# Patient Record
Sex: Male | Born: 1990 | Race: Black or African American | Hispanic: No | Marital: Single | Smoking: Current every day smoker
Health system: Southern US, Community
[De-identification: ages and names within clinical notes are randomized; demographics above are authoritative.]

## PROBLEM LIST (undated history)

## (undated) DIAGNOSIS — M549 Dorsalgia, unspecified: Secondary | ICD-10-CM

## (undated) DIAGNOSIS — W3400XA Accidental discharge from unspecified firearms or gun, initial encounter: Secondary | ICD-10-CM

## (undated) DIAGNOSIS — M25569 Pain in unspecified knee: Secondary | ICD-10-CM

## (undated) DIAGNOSIS — S71131A Puncture wound without foreign body, right thigh, initial encounter: Secondary | ICD-10-CM

## (undated) DIAGNOSIS — G8929 Other chronic pain: Secondary | ICD-10-CM

## (undated) DIAGNOSIS — S8990XA Unspecified injury of unspecified lower leg, initial encounter: Secondary | ICD-10-CM

## (undated) HISTORY — PX: KNEE SURGERY: SHX244

---
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
## 2010-05-15 ENCOUNTER — Emergency Department (HOSPITAL_COMMUNITY): Admission: EM | Admit: 2010-05-15 | Discharge: 2010-05-15 | Payer: Self-pay | Admitting: Emergency Medicine

## 2010-07-13 ENCOUNTER — Emergency Department (HOSPITAL_COMMUNITY)
Admission: EM | Admit: 2010-07-13 | Discharge: 2010-07-13 | Payer: Self-pay | Source: Home / Self Care | Admitting: Emergency Medicine

## 2010-09-29 ENCOUNTER — Emergency Department (HOSPITAL_COMMUNITY)
Admission: EM | Admit: 2010-09-29 | Discharge: 2010-09-29 | Disposition: A | Discharge status: Home / Self Care | Payer: Self-pay | Attending: Emergency Medicine | Admitting: Emergency Medicine

## 2010-09-29 ENCOUNTER — Emergency Department (HOSPITAL_COMMUNITY): Payer: Self-pay

## 2010-09-29 DIAGNOSIS — Y9361 Activity, american tackle football: Secondary | ICD-10-CM | POA: Insufficient documentation

## 2010-09-29 DIAGNOSIS — Y92009 Unspecified place in unspecified non-institutional (private) residence as the place of occurrence of the external cause: Secondary | ICD-10-CM | POA: Insufficient documentation

## 2010-09-29 DIAGNOSIS — S93409A Sprain of unspecified ligament of unspecified ankle, initial encounter: Secondary | ICD-10-CM | POA: Insufficient documentation

## 2010-09-29 DIAGNOSIS — X500XXA Overexertion from strenuous movement or load, initial encounter: Secondary | ICD-10-CM | POA: Insufficient documentation

## 2011-01-23 ENCOUNTER — Emergency Department (HOSPITAL_COMMUNITY)
Admission: EM | Admit: 2011-01-23 | Discharge: 2011-01-23 | Disposition: A | Discharge status: Home / Self Care | Payer: Self-pay | Attending: Emergency Medicine | Admitting: Emergency Medicine

## 2011-01-23 DIAGNOSIS — R300 Dysuria: Secondary | ICD-10-CM | POA: Insufficient documentation

## 2011-01-23 DIAGNOSIS — J029 Acute pharyngitis, unspecified: Secondary | ICD-10-CM | POA: Insufficient documentation

## 2011-01-23 DIAGNOSIS — J358 Other chronic diseases of tonsils and adenoids: Secondary | ICD-10-CM

## 2011-01-23 DIAGNOSIS — F17200 Nicotine dependence, unspecified, uncomplicated: Secondary | ICD-10-CM | POA: Insufficient documentation

## 2011-01-23 LAB — URINALYSIS, ROUTINE W REFLEX MICROSCOPIC
Bilirubin Urine: NEGATIVE
Glucose, UA: NEGATIVE mg/dL
Hgb urine dipstick: NEGATIVE
Ketones, ur: NEGATIVE mg/dL
Leukocytes, UA: NEGATIVE
Nitrite: NEGATIVE
Specific Gravity, Urine: <1.005 — ABNORMAL LOW (ref 1.005–1.030)
Urobilinogen, UA: >8 mg/dL — ABNORMAL HIGH (ref 0.0–1.0)
pH: 6.5 (ref 5.0–8.0)

## 2011-01-23 LAB — RAPID STREP SCREEN (MED CTR MEBANE ONLY): Streptococcus, Group A Screen (Direct): NEGATIVE

## 2011-01-23 MED ORDER — PENICILLIN G BENZATHINE 1200000 UNIT/2ML IM SUSP
1.2000 10*6.[IU] | Freq: Once | INTRAMUSCULAR | Status: AC
  Administered 2011-01-23: 1.2 10*6.[IU] via INTRAMUSCULAR
  Filled 2011-01-23: qty 2

## 2011-01-23 MED ORDER — ACETAMINOPHEN 325 MG PO TABS
650.0000 mg | ORAL_TABLET | Freq: Once | ORAL | Status: AC
  Administered 2011-01-23: 650 mg via ORAL
  Filled 2011-01-23: qty 2

## 2011-01-23 NOTE — ED Notes (Signed)
Left in c/o family for transport home; a&ox4, in no distress; verbalizes understanding of instructions given

## 2011-01-23 NOTE — ED Notes (Signed)
Pt reports he remains unable to provide UA specimen after encouraged to try

## 2011-01-23 NOTE — ED Provider Notes (Signed)
History    Written by Enos Fling acting as scribe for Nelia Shi, MD.  Chief Complaint  Patient presents with  . Sore Throat  . Dysuria   Patient is a 20 y.o. male presenting with pharyngitis and dysuria. The history is provided by the patient.  Sore Throat This is a new problem. The current episode started more than 2 days ago (3 days ago). The problem occurs constantly. The problem has been gradually worsening. Pertinent negatives include no chest pain, no abdominal pain, no headaches and no shortness of breath. The symptoms are aggravated by swallowing. The symptoms are relieved by nothing.  Dysuria  This is a new problem. The current episode started more than 2 days ago (3 days ago). The problem occurs intermittently. The problem has not changed since onset.The quality of the pain is described as burning. The pain is mild. The maximum temperature recorded prior to his arrival was 103 to 104 F (103 in ED, subjective f/c at home but did not record). The fever has been present for 1 to 2 days. He is sexually active (unprotected intercourse 4 days ago). Associated symptoms include chills. Pertinent negatives include no sweats, no nausea, no vomiting, no discharge, no hematuria and no flank pain.    History reviewed. No pertinent past medical history.  History reviewed. No pertinent past surgical history.  History reviewed. No pertinent family history.  History  Substance Use Topics  . Smoking status: Current Everyday Smoker  . Smokeless tobacco: Not on file  . Alcohol Use: No      Review of Systems  Constitutional: Positive for fever and chills.  HENT: Positive for sore throat. Negative for congestion.   Respiratory: Negative for shortness of breath.   Cardiovascular: Negative for chest pain.  Gastrointestinal: Negative for nausea, vomiting and abdominal pain.  Genitourinary: Positive for dysuria. Negative for hematuria and flank pain.  Neurological: Negative for  headaches.  All other systems reviewed and are negative.    Physical Exam  BP 104/62  Pulse 102  Temp(Src) 100.3 F (37.9 C) (Oral)  Resp 21  Ht 6\' 1"  (1.854 m)  Wt 225 lb 9.6 oz (102.331 kg)  BMI 29.76 kg/m2  SpO2 95%  Physical Exam  Nursing note and vitals reviewed. Constitutional: He is oriented to person, place, and time. He appears well-developed and well-nourished. No distress.  HENT:  Head: Normocephalic and atraumatic.  Mouth/Throat: Uvula is midline and mucous membranes are normal. Oropharyngeal exudate and posterior oropharyngeal erythema present. No tonsillar abscesses.       Left tonsil with exudative pharyngitis; no sign of abscess  Eyes: Pupils are equal, round, and reactive to light.  Neck: Normal range of motion.  Cardiovascular: Normal rate and intact distal pulses.   Pulmonary/Chest: No respiratory distress.  Abdominal: Normal appearance. He exhibits no distension.  Musculoskeletal: Normal range of motion.  Neurological: He is alert and oriented to person, place, and time. No cranial nerve deficit.  Skin: Skin is warm and dry. No rash noted.  Psychiatric: He has a normal mood and affect. His behavior is normal.    ED Course  Procedures Results for orders placed during the hospital encounter of 01/23/11  RAPID STREP SCREEN      Component Value Range   Streptococcus, Group A Screen (Direct) NEGATIVE  NEGATIVE    Rad: No results found.   MDM Fever, sore throat and left tonsillar swelling/exudate c/w exudative pharyngitis, rapid strep negative, will give 1.2 million units Bicillin to cover  pharyngitis as well as possible infection causing mild dysuria.    I personally performed the services described in this documentation, which was scribed in my presence. The recorded information has been reviewed and considered.   Nelia Shi, MD 01/28/11 [phone]

## 2011-01-23 NOTE — ED Notes (Signed)
Pt c/o burning with urination and sore throat x 3 days.

## 2011-01-23 NOTE — ED Notes (Signed)
Pt up to bathroom, but states unable to provide specimen

## 2011-01-26 ENCOUNTER — Emergency Department (HOSPITAL_COMMUNITY)
Admission: EM | Admit: 2011-01-26 | Discharge: 2011-01-26 | Disposition: A | Discharge status: Home / Self Care | Payer: Self-pay | Attending: Emergency Medicine | Admitting: Emergency Medicine

## 2011-01-26 ENCOUNTER — Encounter (HOSPITAL_COMMUNITY): Payer: Self-pay | Admitting: *Deleted

## 2011-01-26 DIAGNOSIS — F17200 Nicotine dependence, unspecified, uncomplicated: Secondary | ICD-10-CM | POA: Insufficient documentation

## 2011-01-26 DIAGNOSIS — J020 Streptococcal pharyngitis: Secondary | ICD-10-CM | POA: Insufficient documentation

## 2011-01-26 LAB — RAPID STREP SCREEN (MED CTR MEBANE ONLY): Streptococcus, Group A Screen (Direct): POSITIVE — AB

## 2011-01-26 MED ORDER — CEFTRIAXONE SODIUM 1 G IJ SOLR
1.0000 g | Freq: Once | INTRAMUSCULAR | Status: AC
  Administered 2011-01-26: 1 g via INTRAMUSCULAR
  Filled 2011-01-26: qty 1

## 2011-01-26 MED ORDER — IBUPROFEN 100 MG/5ML PO SUSP
ORAL | Status: AC
  Administered 2011-01-26: 800 mg
  Filled 2011-01-26: qty 5

## 2011-01-26 MED ORDER — HYDROCODONE-ACETAMINOPHEN 5-500 MG PO TABS: 1.0000 | ORAL_TABLET | ORAL | Status: AC | PRN

## 2011-01-26 MED ORDER — CLINDAMYCIN HCL 150 MG PO CAPS: 150.0000 mg | ORAL_CAPSULE | Freq: Three times a day (TID) | ORAL | Status: AC

## 2011-01-26 MED ORDER — CLINDAMYCIN HCL 150 MG PO CAPS
300.0000 mg | ORAL_CAPSULE | Freq: Once | ORAL | Status: AC
  Administered 2011-01-26: 300 mg via ORAL
  Filled 2011-01-26: qty 2

## 2011-01-26 MED ORDER — IBUPROFEN 100 MG/5ML PO SUSP
ORAL | Status: AC
  Filled 2011-01-26: qty 35

## 2011-01-26 MED ORDER — IBUPROFEN 100 MG/5ML PO SUSP
800.0000 mg | Freq: Once | ORAL | Status: DC
  Filled 2011-01-26: qty 40

## 2011-01-26 NOTE — ED Provider Notes (Signed)
Medical screening examination/treatment/procedure(s) were performed by non-physician practitioner and as supervising physician I was immediately available for consultation/collaboration.   Erubiel Manasco L Sayge Salvato, MD 01/26/11 2300 

## 2011-01-26 NOTE — ED Provider Notes (Signed)
History     Chief Complaint  Patient presents with  . Sore Throat   Patient is a 20 y.o. male presenting with pharyngitis. The history is provided by the patient.  Sore Throat This is a recurrent problem. The current episode started in the past 7 days. The problem occurs daily. The problem has been gradually worsening. Associated symptoms include fatigue, a fever, headaches and myalgias. Pertinent negatives include no abdominal pain, arthralgias, chest pain, coughing or neck pain. The symptoms are aggravated by drinking and eating. He has tried acetaminophen and NSAIDs (Bicillin LA) for the symptoms. The treatment provided no relief.    History reviewed. No pertinent past medical history.  History reviewed. No pertinent past surgical history.  No family history on file.  History  Substance Use Topics  . Smoking status: Current Everyday Smoker  . Smokeless tobacco: Not on file  . Alcohol Use: No      Review of Systems  Constitutional: Positive for fever and fatigue. Negative for activity change.       All ROS Neg except as noted in HPI  HENT: Negative for nosebleeds and neck pain.   Eyes: Negative for photophobia and discharge.  Respiratory: Negative for cough, shortness of breath and wheezing.   Cardiovascular: Negative for chest pain and palpitations.  Gastrointestinal: Negative for abdominal pain and blood in stool.  Genitourinary: Negative for dysuria, frequency and hematuria.  Musculoskeletal: Positive for myalgias. Negative for back pain and arthralgias.  Skin: Negative.   Neurological: Positive for headaches. Negative for dizziness, seizures and speech difficulty.  Psychiatric/Behavioral: Negative for hallucinations and confusion.    Physical Exam  BP 119/63  Pulse 98  Temp 99.4 F (37.4 C)  Resp 16  Ht 6\' 1"  (1.854 m)  Wt 225 lb (102.059 kg)  BMI 29.69 kg/m2  SpO2 99%  Physical Exam  Nursing note and vitals reviewed. Constitutional: He is oriented to  person, place, and time. He appears well-developed and well-nourished.  Non-toxic appearance.  HENT:  Head: Normocephalic.  Right Ear: Tympanic membrane and external ear normal.  Left Ear: Tympanic membrane and external ear normal.       Bilat tonsilar exudate and swelling. Increase redness of the posterior pharynx. No visible abscess  Eyes: EOM and lids are normal. Pupils are equal, round, and reactive to light.  Neck: Normal range of motion. Neck supple. Carotid bruit is not present.  Cardiovascular: Normal rate, regular rhythm, normal heart sounds, intact distal pulses and normal pulses.   Pulmonary/Chest: Breath sounds normal. No respiratory distress.  Abdominal: Soft. Bowel sounds are normal. There is no tenderness. There is no guarding.  Musculoskeletal: Normal range of motion.  Lymphadenopathy:       Head (right side): No submandibular adenopathy present.       Head (left side): No submandibular adenopathy present.    He has no cervical adenopathy.  Neurological: He is alert and oriented to person, place, and time. He has normal strength. No cranial nerve deficit or sensory deficit.  Skin: Skin is warm and dry.  Psychiatric: He has a normal mood and affect. His speech is normal.    ED Course  Procedures  MDM Rapid strep after bicillin POS. IM Rocephin given. Oral clindamycin also given. Will attempt to obtain a throat culture.      Kathie Dike, [place] 01/26/11 2010

## 2011-01-26 NOTE — ED Notes (Signed)
Sore throat x 2 weeks. Received a shot 2-3 days ago for sore throat. Pt states sore throat has gotten worse since last visit. NAD.

## 2011-01-28 LAB — STREP A DNA PROBE: Group A Strep Probe: NEGATIVE

## 2012-03-06 ENCOUNTER — Emergency Department (HOSPITAL_COMMUNITY)
Admission: EM | Admit: 2012-03-06 | Discharge: 2012-03-06 | Disposition: A | Discharge status: Home / Self Care | Payer: Self-pay | Attending: Emergency Medicine | Admitting: Emergency Medicine

## 2012-03-06 ENCOUNTER — Encounter (HOSPITAL_COMMUNITY): Payer: Self-pay | Admitting: *Deleted

## 2012-03-06 DIAGNOSIS — R21 Rash and other nonspecific skin eruption: Secondary | ICD-10-CM

## 2012-03-06 DIAGNOSIS — N5089 Other specified disorders of the male genital organs: Secondary | ICD-10-CM | POA: Insufficient documentation

## 2012-03-06 DIAGNOSIS — F17200 Nicotine dependence, unspecified, uncomplicated: Secondary | ICD-10-CM | POA: Insufficient documentation

## 2012-03-06 MED ORDER — CLOTRIMAZOLE-BETAMETHASONE 1-0.05 % EX CREA: TOPICAL_CREAM | CUTANEOUS | Status: AC

## 2012-03-06 NOTE — ED Notes (Signed)
Here to be checked for STD 

## 2012-03-06 NOTE — ED Provider Notes (Signed)
History  This chart was scribed for Timothy Lennert, MD by Ladona Ridgel Day. This patient was seen in room APA03/APA03 and the patient's care was started at 1734.   CSN: [id number]  Arrival date & time 03/06/12  1734   First MD Initiated Contact with Patient 03/06/12 1854      Chief Complaint  Patient presents with  . S74.5    Patient is a 21 y.o. male presenting with testicular pain. The history is provided by the patient. No language interpreter was used.  Testicle Pain This is a new problem. The current episode started yesterday. The problem occurs constantly. The problem has not changed since onset.Pertinent negatives include no chest pain and no abdominal pain. Nothing aggravates the symptoms. Nothing relieves the symptoms. He has tried nothing for the symptoms.   Timothy Ayala is a 21 y.o. male who presents to the Emergency Department to be check for an STD. He states he recently noticed some red bumps over his scrotum area but denies any penile discharge. He states had intercourse with a woman a few days ago but she told him that she does not have any STIs.    He has no PCP  History reviewed. No pertinent past medical history.  History reviewed. No pertinent past surgical history.  No family history on file.  History  Substance Use Topics  . Smoking status: Current Everyday Smoker  . Smokeless tobacco: Not on file  . Alcohol Use: No      Review of Systems  Constitutional: Negative for fatigue.  HENT: Negative for congestion, sinus pressure and ear discharge.   Eyes: Negative for discharge.  Respiratory: Negative for cough.   Cardiovascular: Negative for chest pain.  Gastrointestinal: Negative for abdominal pain and diarrhea.  Genitourinary: Positive for testicular pain. Negative for frequency and hematuria.  Musculoskeletal: Negative for back pain.  Skin: Positive for rash (Red bumps over his scrotal area.).  Neurological: Negative for seizures.  Hematological:  Negative.   Psychiatric/Behavioral: Negative for hallucinations.  All other systems reviewed and are negative.    Allergies  Review of patient's allergies indicates no known allergies.  Home Medications  No current outpatient prescriptions on file.  Triage Vitals: BP 117/59  Pulse 66  Temp 98.6 F (37 C) (Oral)  Resp 16  Ht 6\' 4"  (1.93 m)  Wt 210 lb (95.255 kg)  BMI 25.56 kg/m2  SpO2 100%  Physical Exam  Nursing note and vitals reviewed. Constitutional: He is oriented to person, place, and time. He appears well-developed.  HENT:  Head: Normocephalic.  Eyes: Conjunctivae are normal.  Neck: No tracheal deviation present.  Cardiovascular:  No murmur heard. Genitourinary:       Swelling and tenderness over scrotum.   Musculoskeletal: Normal range of motion.  Neurological: He is oriented to person, place, and time.  Skin: Skin is warm.  Psychiatric: He has a normal mood and affect.    ED Course  Procedures (including critical care time) DIAGNOSTIC STUDIES: Oxygen Saturation is 100% on room air, normal by my interpretation.    COORDINATION OF CARE: At 710 PM Discussed treatment plan with patient which includes GC/chlyamydia screen. Patient agrees.   Labs Reviewed - No data to display No results found.   No diagnosis found.   Possible early fungal rash MDM  The chart was scribed for me under my direct supervision.  I personally performed the history, physical, and medical decision making and all procedures in the evaluation of this patient.Marland Kitchen  Timothy Lennert, MD 03/06/12 1929

## 2012-03-06 NOTE — ED Notes (Signed)
Pt denies any symptoms other than bumps to scrotal area. Denies open sores, "just bumps":

## 2012-03-06 NOTE — ED Notes (Signed)
Denies pain/discharge.

## 2013-02-27 ENCOUNTER — Emergency Department (HOSPITAL_COMMUNITY)
Admission: EM | Admit: 2013-02-27 | Discharge: 2013-02-27 | Disposition: A | Discharge status: Home / Self Care | Payer: Self-pay

## 2013-04-16 ENCOUNTER — Emergency Department (HOSPITAL_COMMUNITY)
Admission: EM | Admit: 2013-04-16 | Discharge: 2013-04-16 | Disposition: A | Discharge status: Home / Self Care | Payer: Self-pay | Attending: Emergency Medicine | Admitting: Emergency Medicine

## 2013-04-16 ENCOUNTER — Emergency Department (HOSPITAL_COMMUNITY): Payer: Self-pay

## 2013-04-16 ENCOUNTER — Encounter (HOSPITAL_COMMUNITY): Payer: Self-pay | Admitting: Emergency Medicine

## 2013-04-16 DIAGNOSIS — N419 Inflammatory disease of prostate, unspecified: Secondary | ICD-10-CM | POA: Insufficient documentation

## 2013-04-16 DIAGNOSIS — N390 Urinary tract infection, site not specified: Secondary | ICD-10-CM | POA: Insufficient documentation

## 2013-04-16 DIAGNOSIS — F17200 Nicotine dependence, unspecified, uncomplicated: Secondary | ICD-10-CM | POA: Insufficient documentation

## 2013-04-16 DIAGNOSIS — M549 Dorsalgia, unspecified: Secondary | ICD-10-CM | POA: Insufficient documentation

## 2013-04-16 LAB — CBC WITH DIFFERENTIAL/PLATELET
Basophils Absolute: 0 10*3/uL (ref 0.0–0.1)
Eosinophils Relative: 7 % — ABNORMAL HIGH (ref 0–5)
HCT: 40.2 % (ref 39.0–52.0)
Lymphocytes Relative: 23 % (ref 12–46)
Lymphs Abs: 2.3 10*3/uL (ref 0.7–4.0)
MCV: 89.3 fL (ref 78.0–100.0)
Monocytes Absolute: 0.9 10*3/uL (ref 0.1–1.0)
RDW: 12.5 % (ref 11.5–15.5)
WBC: 9.8 10*3/uL (ref 4.0–10.5)

## 2013-04-16 LAB — BASIC METABOLIC PANEL
BUN: 7 mg/dL (ref 6–23)
CO2: 26 mEq/L (ref 19–32)
Calcium: 10.1 mg/dL (ref 8.4–10.5)
Creatinine, Ser: 0.94 mg/dL (ref 0.50–1.35)
Glucose, Bld: 82 mg/dL (ref 70–99)

## 2013-04-16 LAB — URINE MICROSCOPIC-ADD ON

## 2013-04-16 LAB — URINALYSIS, ROUTINE W REFLEX MICROSCOPIC
Glucose, UA: NEGATIVE mg/dL
Hgb urine dipstick: NEGATIVE
Protein, ur: NEGATIVE mg/dL

## 2013-04-16 MED ORDER — DOXYCYCLINE HYCLATE 100 MG PO CAPS: 100.0000 mg | ORAL_CAPSULE | Freq: Two times a day (BID) | ORAL | Status: DC

## 2013-04-16 MED ORDER — CEFTRIAXONE SODIUM 250 MG IJ SOLR
250.0000 mg | Freq: Once | INTRAMUSCULAR | Status: AC
  Administered 2013-04-16: 250 mg via INTRAMUSCULAR
  Filled 2013-04-16: qty 250

## 2013-04-16 MED ORDER — HYDROMORPHONE HCL PF 1 MG/ML IJ SOLN
1.0000 mg | Freq: Once | INTRAMUSCULAR | Status: AC
  Administered 2013-04-16: 1 mg via INTRAVENOUS
  Filled 2013-04-16: qty 1

## 2013-04-16 MED ORDER — IOHEXOL 300 MG/ML  SOLN
100.0000 mL | Freq: Once | INTRAMUSCULAR | Status: AC | PRN
  Administered 2013-04-16: 100 mL via INTRAVENOUS

## 2013-04-16 MED ORDER — SODIUM CHLORIDE 0.9 % IV SOLN
INTRAVENOUS | Status: DC
  Administered 2013-04-16: 17:00:00 via INTRAVENOUS

## 2013-04-16 MED ORDER — NAPROXEN 500 MG PO TABS
500.0000 mg | ORAL_TABLET | Freq: Two times a day (BID) | ORAL | Status: DC
Start: 2013-04-16 — End: 2013-11-18

## 2013-04-16 MED ORDER — SODIUM CHLORIDE 0.9 % IV BOLUS (SEPSIS)
500.0000 mL | Freq: Once | INTRAVENOUS | Status: AC
  Administered 2013-04-16: 16:00:00 via INTRAVENOUS

## 2013-04-16 MED ORDER — IOHEXOL 300 MG/ML  SOLN
50.0000 mL | Freq: Once | INTRAMUSCULAR | Status: AC | PRN
  Administered 2013-04-16: 50 mL via ORAL

## 2013-04-16 MED ORDER — ONDANSETRON HCL 4 MG/2ML IJ SOLN
4.0000 mg | Freq: Once | INTRAMUSCULAR | Status: AC
  Administered 2013-04-16: 4 mg via INTRAVENOUS
  Filled 2013-04-16: qty 2

## 2013-04-16 MED ORDER — LIDOCAINE HCL (PF) 1 % IJ SOLN
INTRAMUSCULAR | Status: AC
  Administered 2013-04-16: 5 mL via INTRAMUSCULAR
  Filled 2013-04-16: qty 5

## 2013-04-16 NOTE — ED Notes (Addendum)
Severe L pelvic pain beginning below umbilicus and radiating to L back. Sharp stabbing pain began 2 weeks ago and has been unrelenting.  Patient tearful.  Has seen some blood in urine, no discharge, no burning w/urination.  Denies blood in stool. No vomiting or diarrhea.  Has tried drinking cranberry juice.  Has not taken any medication.

## 2013-04-16 NOTE — ED Provider Notes (Signed)
CSN: [id number]     Arrival date & time 04/16/13  1453 History  This chart was scribed for Shelda Jakes, MD by Danella Maiers, ED Scribe. This patient was seen in room APA16A/APA16A and the patient's care was started at 3:18 PM.   Chief Complaint  Patient presents with  . Pelvic Pain   Patient is a 22 y.o. male presenting with pelvic pain. The history is provided by the patient. No language interpreter was used.  Pelvic Pain This is a new problem. The current episode started more than 1 week ago. The problem occurs constantly. The problem has been gradually worsening. Associated symptoms include abdominal pain. Pertinent negatives include no chest pain, no headaches and no shortness of breath. Nothing relieves the symptoms. He has tried nothing for the symptoms.   HPI Comments: VIRGINIO ISIDORE is a 22 y.o. male who presents to the Emergency Department complaining of severe, waxing and waning, stabbing LLQ abdominal pain that radiates to left back onset two weeks ago. He states the pain started in his testicles and lasted for one day before it moved to his abdomen and denies testicular pain since. He rates the severity as 10/10. He also reports urinary urgency. Pt reports associated hematuria. Pt has tried drinking cranberry juice. He has not tried any medication. He denies h/o kidney pain.  History reviewed. No pertinent past medical history. No past surgical history on file. No family history on file. History  Substance Use Topics  . Smoking status: Current Every Day Smoker  . Smokeless tobacco: Not on file  . Alcohol Use: No    Review of Systems  Constitutional: Negative for fever and chills.  HENT: Negative for rhinorrhea and sore throat.   Eyes: Negative for visual disturbance.  Respiratory: Negative for cough and shortness of breath.   Cardiovascular: Negative for chest pain and leg swelling.  Gastrointestinal: Positive for abdominal pain. Negative for nausea, vomiting,  diarrhea and blood in stool.  Genitourinary: Positive for urgency and pelvic pain. Negative for dysuria and discharge.  Musculoskeletal: Positive for back pain.  Skin: Negative for rash.  Neurological: Negative for headaches.  Hematological: Does not bruise/bleed easily.  Psychiatric/Behavioral: Negative for confusion.  All other systems reviewed and are negative.    Allergies  Review of patient's allergies indicates no known allergies.  Home Medications  No current outpatient prescriptions on file. BP 136/74  Pulse 73  Temp(Src) 97.4 F (36.3 C) (Oral)  Resp 19  SpO2 100% Physical Exam  Nursing note and vitals reviewed. Constitutional: He is oriented to person, place, and time. He appears well-developed and well-nourished. No distress.  HENT:  Head: Normocephalic and atraumatic.  Eyes: EOM are normal. No scleral icterus.  Neck: Neck supple. No tracheal deviation present.  Cardiovascular: Normal rate, regular rhythm and normal heart sounds.   Pulmonary/Chest: Effort normal and breath sounds normal. No respiratory distress.  Abdominal: He exhibits no mass. There is tenderness (LLQ and left groin).  Genitourinary: Penis normal. No penile tenderness.  Tenderness in the groin area. Testicles non-tender. No hernia.  Musculoskeletal: Normal range of motion.  Neurological: He is alert and oriented to person, place, and time. No cranial nerve deficit. He exhibits normal muscle tone. Coordination normal.  Skin: Skin is warm and dry.  Psychiatric: He has a normal mood and affect. His behavior is normal.    ED Course  Procedures (including critical care time) Medications  0.9 %  sodium chloride infusion ( Intravenous Stopped 04/16/13 1809)  sodium  chloride 0.9 % bolus 500 mL (0 mLs Intravenous Stopped 04/16/13 1645)  ondansetron (ZOFRAN) injection 4 mg (4 mg Intravenous Given 04/16/13 1617)  HYDROmorphone (DILAUDID) injection 1 mg (1 mg Intravenous Given 04/16/13 1617)  iohexol  (OMNIPAQUE) 300 MG/ML solution 50 mL (50 mLs Oral Contrast Given 04/16/13 1752)  iohexol (OMNIPAQUE) 300 MG/ML solution 100 mL (100 mLs Intravenous Contrast Given 04/16/13 1813)    DIAGNOSTIC STUDIES: Oxygen Saturation is 100% on RA, normal by my interpretation.    COORDINATION OF CARE: 4:06 PM- Discussed treatment plan with pt which includes blood work, UA, and pain medication. Pt agrees to plan.   Results for orders placed during the hospital encounter of 04/16/13  URINALYSIS, ROUTINE W REFLEX MICROSCOPIC      Result Value Range   Color, Urine YELLOW  YELLOW   APPearance CLEAR  CLEAR   Specific Gravity, Urine 1.010  1.005 - 1.030   pH 6.0  5.0 - 8.0   Glucose, UA NEGATIVE  NEGATIVE mg/dL   Hgb urine dipstick NEGATIVE  NEGATIVE   Bilirubin Urine NEGATIVE  NEGATIVE   Ketones, ur NEGATIVE  NEGATIVE mg/dL   Protein, ur NEGATIVE  NEGATIVE mg/dL   Urobilinogen, UA 0.2  0.0 - 1.0 mg/dL   Nitrite NEGATIVE  NEGATIVE   Leukocytes, UA TRACE (*) NEGATIVE  CBC WITH DIFFERENTIAL      Result Value Range   WBC 9.8  4.0 - 10.5 K/uL   RBC 4.50  4.22 - 5.81 MIL/uL   Hemoglobin 14.6  13.0 - 17.0 g/dL   HCT 78.2  95.6 - 21.3 %   MCV 89.3  78.0 - 100.0 fL   MCH 32.4  26.0 - 34.0 pg   MCHC 36.3 (*) 30.0 - 36.0 g/dL   RDW 08.6  57.8 - 46.9 %   Platelets 253  150 - 400 K/uL   Neutrophils Relative % 61  43 - 77 %   Neutro Abs 5.9  1.7 - 7.7 K/uL   Lymphocytes Relative 23  12 - 46 %   Lymphs Abs 2.3  0.7 - 4.0 K/uL   Monocytes Relative 9  3 - 12 %   Monocytes Absolute 0.9  0.1 - 1.0 K/uL   Eosinophils Relative 7 (*) 0 - 5 %   Eosinophils Absolute 0.7  0.0 - 0.7 K/uL   Basophils Relative 0  0 - 1 %   Basophils Absolute 0.0  0.0 - 0.1 K/uL  BASIC METABOLIC PANEL      Result Value Range   Sodium 140  135 - 145 mEq/L   Potassium 3.4 (*) 3.5 - 5.1 mEq/L   Chloride 100  96 - 112 mEq/L   CO2 26  19 - 32 mEq/L   Glucose, Bld 82  70 - 99 mg/dL   BUN 7  6 - 23 mg/dL   Creatinine, Ser 6.29  0.50 -  1.35 mg/dL   Calcium 52.8  8.4 - 41.3 mg/dL   GFR calc non Af Amer >90  >90 mL/min   GFR calc Af Amer >90  >90 mL/min  URINE MICROSCOPIC-ADD ON      Result Value Range   WBC, UA 3-6  <3 WBC/hpf   Bacteria, UA RARE  RARE   Ct Abdomen Pelvis W Contrast  04/16/2013   CLINICAL DATA:  Left-sided pelvic and back pain for 2 weeks.  EXAM: CT ABDOMEN AND PELVIS WITH CONTRAST  TECHNIQUE: Multidetector CT imaging of the abdomen and pelvis was performed using the standard  protocol following bolus administration of intravenous contrast.  CONTRAST:  50mL OMNIPAQUE IOHEXOL 300 MG/ML SOLN, OMNIPAQUE IOHEXOL 300 MG/ML SOLN  COMPARISON:  None.  FINDINGS: Small area of lucency in the posterior right lower lobe may be from air trapping. The lung bases are otherwise clear.  Normal liver, spleen, gallbladder and pancreas. No bile duct dilation. No adrenal masses. Normal kidneys and ureters.  There are inflammatory changes along the posterior wall of the bladder contiguous with the seminal vesicles and superior prostate gland. This is also contiguous with a portion of the sigmoid colon. Possibly all views included mild cystitis or prostatitis. Mild diverticulitis should also be considered in the proper clinical setting although was fell least likely. No bladder mass or stone is seen. Trace pelvic free fluid is noted.  Mild increased stool is noted in the colon. The bowel is otherwise unremarkable. A normal appendix is visualized.  Minor Schmorl's nodes are noted mostly of the lower thoracic spine. No other bony abnormality.  IMPRESSION: 1. Mild inflammatory changes are noted in the lower pelvic fat along the posterior margin of the bladder contiguous with the upper prostate and seminal vesicles. This is nonspecific. A trace amount pelvic free fluid is also present. Consider prostatitis, a mild cystitis or possible a mild uncomplicated diverticulitis. 2. No other evidence of an acute abnormality. A normal appendix is  visualized. 3. Small area of presumed air trapping in the right lower 4. No other abnormalities.   Electronically Signed   By: Amie Portland M.D.   On: 04/16/2013 18:33        MDM   1. Prostatitis    Clinically more consistent with prostate infection. Will treat as such. Doubt diverticulitis. Nontoxic NAD. Patient will be given antibiotic course.       I personally performed the services described in this documentation, which was scribed in my presence. The recorded information has been reviewed and is accurate.     Shelda Jakes, MD 04/20/13 [phone]

## 2013-04-16 NOTE — ED Notes (Signed)
Indicated pain in left groin area for two weeks. Denies any urinary symptoms or duscharge

## 2013-07-02 DIAGNOSIS — S71131A Puncture wound without foreign body, right thigh, initial encounter: Secondary | ICD-10-CM

## 2013-07-02 DIAGNOSIS — S8990XA Unspecified injury of unspecified lower leg, initial encounter: Secondary | ICD-10-CM

## 2013-07-02 HISTORY — DX: Puncture wound without foreign body, right thigh, initial encounter: S71.131A

## 2013-07-02 HISTORY — DX: Unspecified injury of unspecified lower leg, initial encounter: S89.90XA

## 2013-11-18 ENCOUNTER — Emergency Department (HOSPITAL_COMMUNITY)
Admission: EM | Admit: 2013-11-18 | Discharge: 2013-11-18 | Disposition: A | Discharge status: Home / Self Care | Payer: Self-pay | Attending: Emergency Medicine | Admitting: Emergency Medicine

## 2013-11-18 ENCOUNTER — Encounter (HOSPITAL_COMMUNITY): Payer: Self-pay | Admitting: Emergency Medicine

## 2013-11-18 DIAGNOSIS — Z79899 Other long term (current) drug therapy: Secondary | ICD-10-CM | POA: Insufficient documentation

## 2013-11-18 DIAGNOSIS — Z113 Encounter for screening for infections with a predominantly sexual mode of transmission: Secondary | ICD-10-CM | POA: Insufficient documentation

## 2013-11-18 DIAGNOSIS — Z202 Contact with and (suspected) exposure to infections with a predominantly sexual mode of transmission: Secondary | ICD-10-CM

## 2013-11-18 DIAGNOSIS — F17200 Nicotine dependence, unspecified, uncomplicated: Secondary | ICD-10-CM | POA: Insufficient documentation

## 2013-11-18 DIAGNOSIS — Z791 Long term (current) use of non-steroidal anti-inflammatories (NSAID): Secondary | ICD-10-CM | POA: Insufficient documentation

## 2013-11-18 LAB — URINALYSIS, ROUTINE W REFLEX MICROSCOPIC
BILIRUBIN URINE: NEGATIVE
Glucose, UA: NEGATIVE mg/dL
Hgb urine dipstick: NEGATIVE
Ketones, ur: NEGATIVE mg/dL
LEUKOCYTES UA: NEGATIVE
NITRITE: NEGATIVE
PH: 6 (ref 5.0–8.0)
Protein, ur: NEGATIVE mg/dL
Specific Gravity, Urine: >1.03 — ABNORMAL HIGH (ref 1.005–1.030)
Urobilinogen, UA: 1 mg/dL (ref 0.0–1.0)

## 2013-11-18 NOTE — ED Provider Notes (Signed)
CSN: [id number]     Arrival date & time 11/18/13  2007 History   First MD Initiated Contact with Patient 11/18/13 2019     Chief Complaint  Patient presents with  . Illegal value: [     (Consider location/radiation/quality/duration/timing/severity/associated sxs/prior Treatment) HPI Josie SaundersJerrell D Musselman is a 23 y.o. male who presents to the ED requesting STI screening. He states that he last had sex one week ago and 2 days ago his girlfriend told him she is having vaginal discharge and is going to get tested tomorrow. The patient denies any discharge, dysuria or other problems.   History reviewed. No pertinent past medical history. History reviewed. No pertinent past surgical history. History reviewed. No pertinent family history. History  Substance Use Topics  . Smoking status: Current Every Day Smoker    Types: Cigarettes  . Smokeless tobacco: Not on file  . Alcohol Use: No    Review of Systems Negative except as stated in HPI   Allergies  Review of patient's allergies indicates no known allergies.  Home Medications   Prior to Admission medications   Medication Sig Start Date End Date Taking? Authorizing Provider  doxycycline (VIBRAMYCIN) 100 MG capsule Take 1 capsule (100 mg total) by mouth 2 (two) times daily. 04/16/13   Shelda JakesScott W. Zackowski, MD  naproxen (NAPROSYN) 500 MG tablet Take 1 tablet (500 mg total) by mouth 2 (two) times daily with a meal. 04/16/13   Shelda JakesScott W. Zackowski, MD   BP 135/52  Pulse 67  Temp(Src) 98.5 F (36.9 C) (Oral)  Resp 24  Ht 6\' 4"  (1.93 m)  Wt 210 lb (95.255 kg)  BMI 25.57 kg/m2  SpO2 100% Physical Exam  Nursing note and vitals reviewed. Constitutional: He is oriented to person, place, and time. He appears well-developed and well-nourished. No distress.  HENT:  Head: Normocephalic.  Eyes: Conjunctivae and EOM are normal.  Neck: Neck supple.  Cardiovascular: Normal rate.   Pulmonary/Chest: Effort normal.  Abdominal: Soft. There is no  tenderness.  Genitourinary: Testes normal. Circumcised. No penile erythema or penile tenderness. No discharge found.  Musculoskeletal: Normal range of motion.  Lymphadenopathy:       Right: No inguinal adenopathy present.       Left: No inguinal adenopathy present.  Neurological: He is alert and oriented to person, place, and time. No cranial nerve deficit.  Skin: Skin is warm and dry.  Psychiatric: He has a normal mood and affect. His behavior is normal.    ED Course  Procedures   MDM  23 y.o. male with possible STI exposure. Tested for GC and Chlamydia. Encouraged patient to follow up with health department for further screening such as HIV, Hepatitis and syphilis. Discussed with the patient and all questioned fully answered. Will call patient if cultures are positive.   [address] South DriveHope InyokernM Yolanda Dockendorf, [place]NP 11/19/13 [phone]

## 2013-11-18 NOTE — ED Notes (Signed)
Pt states his girlfriend is having discharge. Wants to be checked for STD. Denies having any problems himself,

## 2013-11-18 NOTE — ED Notes (Signed)
Here to be checked for STD per pt.

## 2013-11-18 NOTE — Discharge Instructions (Signed)
You can go to the health department for further screening with blood test for HIV, syphilis and Hepatitis. We will call you if your cultures for Gonorrhea or Chlamydia are positive.

## 2013-11-18 NOTE — ED Notes (Signed)
Making sure i have not been exposed.

## 2013-11-18 NOTE — ED Notes (Signed)
Pt alert & oriented x4, stable gait. Patient given discharge instructions, paperwork & prescription(s). Patient  instructed to stop at the registration desk to finish any additional paperwork. Patient verbalized understanding. Pt left department w/ no further questions. 

## 2013-11-19 NOTE — ED Provider Notes (Signed)
Medical screening examination/treatment/procedure(s) were performed by non-physician practitioner and as supervising physician I was immediately available for consultation/collaboration.   EKG Interpretation None        Sallyanne Birkhead, MD 11/19/13 0127 

## 2013-11-20 LAB — GC/CHLAMYDIA PROBE AMP
CT PROBE, AMP APTIMA: NEGATIVE
GC Probe RNA: NEGATIVE

## 2014-01-10 ENCOUNTER — Emergency Department (HOSPITAL_COMMUNITY): Payer: No Typology Code available for payment source

## 2014-01-10 ENCOUNTER — Observation Stay (HOSPITAL_COMMUNITY)
Admission: EM | Admit: 2014-01-10 | Discharge: 2014-01-11 | Disposition: A | Discharge status: Home / Self Care | Payer: No Typology Code available for payment source | Attending: General Surgery | Admitting: General Surgery

## 2014-01-10 DIAGNOSIS — S71109A Unspecified open wound, unspecified thigh, initial encounter: Secondary | ICD-10-CM

## 2014-01-10 DIAGNOSIS — S01502A Unspecified open wound of oral cavity, initial encounter: Secondary | ICD-10-CM | POA: Diagnosis not present

## 2014-01-10 DIAGNOSIS — S81809A Unspecified open wound, unspecified lower leg, initial encounter: Principal | ICD-10-CM

## 2014-01-10 DIAGNOSIS — S81009A Unspecified open wound, unspecified knee, initial encounter: Principal | ICD-10-CM | POA: Insufficient documentation

## 2014-01-10 DIAGNOSIS — S91009A Unspecified open wound, unspecified ankle, initial encounter: Secondary | ICD-10-CM | POA: Diagnosis not present

## 2014-01-10 DIAGNOSIS — D62 Acute posthemorrhagic anemia: Secondary | ICD-10-CM | POA: Diagnosis not present

## 2014-01-10 DIAGNOSIS — Y92009 Unspecified place in unspecified non-institutional (private) residence as the place of occurrence of the external cause: Secondary | ICD-10-CM | POA: Insufficient documentation

## 2014-01-10 DIAGNOSIS — F17200 Nicotine dependence, unspecified, uncomplicated: Secondary | ICD-10-CM | POA: Insufficient documentation

## 2014-01-10 DIAGNOSIS — T148XXA Other injury of unspecified body region, initial encounter: Secondary | ICD-10-CM | POA: Diagnosis present

## 2014-01-10 DIAGNOSIS — W3400XA Accidental discharge from unspecified firearms or gun, initial encounter: Secondary | ICD-10-CM

## 2014-01-10 DIAGNOSIS — IMO0002 Reserved for concepts with insufficient information to code with codable children: Secondary | ICD-10-CM

## 2014-01-10 DIAGNOSIS — S71009A Unspecified open wound, unspecified hip, initial encounter: Secondary | ICD-10-CM

## 2014-01-10 LAB — CBC WITH DIFFERENTIAL/PLATELET
BASOS ABS: 0 10*3/uL (ref 0.0–0.1)
BASOS PCT: 0 % (ref 0–1)
Eosinophils Absolute: 0.4 10*3/uL (ref 0.0–0.7)
Eosinophils Relative: 5 % (ref 0–5)
HCT: 37.1 % — ABNORMAL LOW (ref 39.0–52.0)
Hemoglobin: 13.3 g/dL (ref 13.0–17.0)
Lymphocytes Relative: 35 % (ref 12–46)
Lymphs Abs: 3 10*3/uL (ref 0.7–4.0)
MCH: 31.7 pg (ref 26.0–34.0)
MCHC: 35.8 g/dL (ref 30.0–36.0)
MCV: 88.5 fL (ref 78.0–100.0)
Monocytes Absolute: 0.5 10*3/uL (ref 0.1–1.0)
Monocytes Relative: 6 % (ref 3–12)
NEUTROS ABS: 4.4 10*3/uL (ref 1.7–7.7)
NEUTROS PCT: 54 % (ref 43–77)
PLATELETS: 227 10*3/uL (ref 150–400)
RBC: 4.19 MIL/uL — ABNORMAL LOW (ref 4.22–5.81)
RDW: 12.3 % (ref 11.5–15.5)
WBC: 8.3 10*3/uL (ref 4.0–10.5)

## 2014-01-10 LAB — COMPREHENSIVE METABOLIC PANEL
ALBUMIN: 4.3 g/dL (ref 3.5–5.2)
ALT: 14 U/L (ref 0–53)
AST: 17 U/L (ref 0–37)
Alkaline Phosphatase: 38 U/L — ABNORMAL LOW (ref 39–117)
Anion gap: 22 — ABNORMAL HIGH (ref 5–15)
BUN: 7 mg/dL (ref 6–23)
CHLORIDE: 100 meq/L (ref 96–112)
CO2: 20 mEq/L (ref 19–32)
Calcium: 9.2 mg/dL (ref 8.4–10.5)
Creatinine, Ser: 1.19 mg/dL (ref 0.50–1.35)
GFR calc Af Amer: >90 mL/min (ref >90)
GFR, EST NON AFRICAN AMERICAN: 86 mL/min — AB (ref >90)
Glucose, Bld: 67 mg/dL — ABNORMAL LOW (ref 70–99)
Potassium: 3 mEq/L — ABNORMAL LOW (ref 3.7–5.3)
SODIUM: 142 meq/L (ref 137–147)
Total Bilirubin: 0.3 mg/dL (ref 0.3–1.2)
Total Protein: 7.5 g/dL (ref 6.0–8.3)

## 2014-01-10 LAB — CBG MONITORING, ED: Glucose-Capillary: 92 mg/dL (ref 70–99)

## 2014-01-10 MED ORDER — SODIUM CHLORIDE 0.9 % IV BOLUS (SEPSIS)
1000.0000 mL | Freq: Once | INTRAVENOUS | Status: AC
  Administered 2014-01-10: 1000 mL via INTRAVENOUS

## 2014-01-10 MED ORDER — OXYCODONE HCL 5 MG PO TABS
10.0000 mg | ORAL_TABLET | ORAL | Status: DC | PRN
  Administered 2014-01-11 (×2): 10 mg via ORAL
  Filled 2014-01-10 (×2): qty 2

## 2014-01-10 MED ORDER — KCL IN DEXTROSE-NACL 20-5-0.45 MEQ/L-%-% IV SOLN
INTRAVENOUS | Status: DC
  Administered 2014-01-10: 23:00:00 via INTRAVENOUS
  Filled 2014-01-10 (×2): qty 1000

## 2014-01-10 MED ORDER — HYDROMORPHONE HCL PF 1 MG/ML IJ SOLN
1.0000 mg | Freq: Once | INTRAMUSCULAR | Status: AC
Start: 2014-01-10 — End: 2014-01-10
  Administered 2014-01-10: 1 mg via INTRAVENOUS

## 2014-01-10 MED ORDER — TETANUS-DIPHTH-ACELL PERTUSSIS 5-2.5-18.5 LF-MCG/0.5 IM SUSP
0.5000 mL | Freq: Once | INTRAMUSCULAR | Status: AC
  Administered 2014-01-10: 0.5 mL via INTRAMUSCULAR
  Filled 2014-01-10: qty 0.5

## 2014-01-10 MED ORDER — OXYCODONE HCL 5 MG PO TABS: 5.0000 mg | ORAL_TABLET | ORAL | Status: DC | PRN

## 2014-01-10 MED ORDER — ENOXAPARIN SODIUM 40 MG/0.4ML ~~LOC~~ SOLN
40.0000 mg | SUBCUTANEOUS | Status: DC
  Filled 2014-01-10: qty 0.4

## 2014-01-10 MED ORDER — HYDROMORPHONE HCL PF 1 MG/ML IJ SOLN
1.0000 mg | Freq: Once | INTRAMUSCULAR | Status: AC
  Administered 2014-01-10: 1 mg via INTRAVENOUS
  Filled 2014-01-10: qty 1

## 2014-01-10 MED ORDER — NICOTINE 14 MG/24HR TD PT24: 14.0000 mg | MEDICATED_PATCH | Freq: Every day | TRANSDERMAL | Status: DC

## 2014-01-10 MED ORDER — ONDANSETRON HCL 4 MG PO TABS: 4.0000 mg | ORAL_TABLET | Freq: Four times a day (QID) | ORAL | Status: DC | PRN

## 2014-01-10 MED ORDER — PANTOPRAZOLE SODIUM 40 MG PO TBEC: 40.0000 mg | DELAYED_RELEASE_TABLET | Freq: Every day | ORAL | Status: DC

## 2014-01-10 MED ORDER — ONDANSETRON HCL 4 MG/2ML IJ SOLN: 4.0000 mg | Freq: Four times a day (QID) | INTRAMUSCULAR | Status: DC | PRN

## 2014-01-10 MED ORDER — PANTOPRAZOLE SODIUM 40 MG IV SOLR
40.0000 mg | Freq: Every day | INTRAVENOUS | Status: DC
  Filled 2014-01-10: qty 40

## 2014-01-10 MED ORDER — HYDROMORPHONE HCL PF 1 MG/ML IJ SOLN
INTRAMUSCULAR | Status: AC
  Administered 2014-01-10: 1 mg via INTRAVENOUS
  Filled 2014-01-10: qty 1

## 2014-01-10 MED ORDER — ACETAMINOPHEN 325 MG PO TABS: 650.0000 mg | ORAL_TABLET | ORAL | Status: DC | PRN

## 2014-01-10 MED ORDER — MORPHINE SULFATE 2 MG/ML IJ SOLN
2.0000 mg | INTRAMUSCULAR | Status: DC | PRN
  Administered 2014-01-10: 4 mg via INTRAVENOUS
  Administered 2014-01-11: 2 mg via INTRAVENOUS
  Filled 2014-01-10: qty 2
  Filled 2014-01-10: qty 1

## 2014-01-10 MED ORDER — CEFAZOLIN SODIUM 1-5 GM-% IV SOLN
1.0000 g | Freq: Once | INTRAVENOUS | Status: AC
  Administered 2014-01-10: 1 g via INTRAVENOUS
  Filled 2014-01-10: qty 50
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
## 2014-01-11 ENCOUNTER — Encounter (HOSPITAL_COMMUNITY): Payer: Self-pay | Admitting: *Deleted

## 2014-01-11 DIAGNOSIS — D62 Acute posthemorrhagic anemia: Secondary | ICD-10-CM | POA: Diagnosis not present

## 2014-01-11 LAB — CBC
HCT: 36 % — ABNORMAL LOW (ref 39.0–52.0)
Hemoglobin: 12.5 g/dL — ABNORMAL LOW (ref 13.0–17.0)
MCH: 31.3 pg (ref 26.0–34.0)
MCHC: 34.7 g/dL (ref 30.0–36.0)
MCV: 90.2 fL (ref 78.0–100.0)
PLATELETS: 195 10*3/uL (ref 150–400)
RBC: 3.99 MIL/uL — AB (ref 4.22–5.81)
RDW: 12.7 % (ref 11.5–15.5)
WBC: 8.8 10*3/uL (ref 4.0–10.5)

## 2014-01-11 MED ORDER — ENOXAPARIN SODIUM 30 MG/0.3ML ~~LOC~~ SOLN
30.0000 mg | Freq: Two times a day (BID) | SUBCUTANEOUS | Status: DC
  Administered 2014-01-11: 30 mg via SUBCUTANEOUS
  Filled 2014-01-11: qty 0.3

## 2014-01-11 MED ORDER — MORPHINE SULFATE 2 MG/ML IJ SOLN
2.0000 mg | INTRAMUSCULAR | Status: DC | PRN
  Administered 2014-01-11: 2 mg via INTRAVENOUS
  Filled 2014-01-11: qty 1

## 2014-01-11 MED ORDER — OXYCODONE HCL 5 MG PO TABS
10.0000 mg | ORAL_TABLET | ORAL | Status: DC | PRN
  Administered 2014-01-11: 10 mg via ORAL
  Filled 2014-01-11: qty 2

## 2014-01-11 MED ORDER — NICOTINE 14 MG/24HR TD PT24
14.0000 mg | MEDICATED_PATCH | Freq: Every day | TRANSDERMAL | Status: DC
  Administered 2014-01-11 (×2): 14 mg via TRANSDERMAL
  Filled 2014-01-11 (×2): qty 1

## 2014-01-11 MED ORDER — OXYCODONE-ACETAMINOPHEN 10-325 MG PO TABS: 1.0000 | ORAL_TABLET | ORAL | Status: DC | PRN
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
## 2014-01-11 NOTE — Progress Notes (Addendum)
[facility].  Pt's home address is:  [address] [place], [place] [address].  The phone listed is correct for pt's mobile phone.   [facility] at 1035am today.  Hopeful for same day approval and discharge.   Carlyle LipaMichelle Genessa Beman, RN BSN MHA CCM  Case Manager, Trauma Service/Unit 52M [phone]

## 2014-01-11 NOTE — Progress Notes (Signed)
Orthopedic Tech Progress Note Patient Details:  Lodema PilotJerrell D XXXWilson 29-Sep-1990 [id number] Delivered for patient use with PT; PT to call back if patient is doing well enough to not need crutches Ortho Devices Type of Ortho Device: Crutches Ortho Device/Splint Interventions: Ordered   Asia R Thompson 01/11/2014, 2:07 PM

## 2014-01-11 NOTE — Discharge Instructions (Signed)
No driving while taking oxycodone. °
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
## 2014-01-11 NOTE — Progress Notes (Signed)
I agree that a negative pressure wound dressing is the best for this wound.    This patient has been seen and I agree with the findings and treatment plan.  Marta LamasJames O. Gae BonWyatt, III, MD, FACS [phone] (pager) [phone] (direct pager) Trauma Surgeon
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
## 2014-01-11 NOTE — Discharge Summary (Signed)
[facility]  This patient has been seen and I agree with the findings and treatment plan.  Marta LamasJames O. Gae BonWyatt, III, MD, FACS [phone] (pager) [phone] (direct pager) Trauma Surgeon

## 2014-01-11 NOTE — Progress Notes (Signed)
Chaplain responded to a request for Spiritual Care. Patient was up and walking around. Mother and Sister were present. After a lengthy conversation, Pt requested prayer for the healing of his leg. Pt is very optimistic about the situation, just concerned about the emotional health of his mother. Prayer given, will return for a follow up.  Timothy RomneyLarry J Brown

## 2014-01-11 NOTE — Progress Notes (Signed)
Patient discharged to home with instructions, verbalized understanding. 

## 2014-01-14 ENCOUNTER — Telehealth (HOSPITAL_COMMUNITY): Payer: Self-pay

## 2014-01-14 MED ORDER — NAPROXEN SODIUM 220 MG PO TABS: 440.0000 mg | ORAL_TABLET | Freq: Two times a day (BID) | ORAL | Status: DC

## 2014-01-14 NOTE — Telephone Encounter (Signed)
Patient called to ask about stronger pain meds. I told him he was on the strongest we had and suggested he take a NSAID along with it.

## 2014-01-14 NOTE — Telephone Encounter (Signed)
Left message authorizing MSW to assist with Medicaid application.

## 2014-01-15 ENCOUNTER — Encounter (HOSPITAL_COMMUNITY): Payer: Self-pay | Admitting: Emergency Medicine

## 2014-01-15 ENCOUNTER — Inpatient Hospital Stay (HOSPITAL_COMMUNITY)
Admission: EM | Admit: 2014-01-15 | Discharge: 2014-01-17 | DRG: 603 | Disposition: A | Discharge status: Home / Self Care | Payer: No Typology Code available for payment source | Attending: General Surgery | Admitting: General Surgery

## 2014-01-15 DIAGNOSIS — M7989 Other specified soft tissue disorders: Secondary | ICD-10-CM | POA: Diagnosis present

## 2014-01-15 DIAGNOSIS — L02419 Cutaneous abscess of limb, unspecified: Secondary | ICD-10-CM | POA: Diagnosis present

## 2014-01-15 DIAGNOSIS — L03115 Cellulitis of right lower limb: Secondary | ICD-10-CM

## 2014-01-15 DIAGNOSIS — D649 Anemia, unspecified: Secondary | ICD-10-CM | POA: Diagnosis present

## 2014-01-15 DIAGNOSIS — W3400XA Accidental discharge from unspecified firearms or gun, initial encounter: Secondary | ICD-10-CM

## 2014-01-15 DIAGNOSIS — F17200 Nicotine dependence, unspecified, uncomplicated: Secondary | ICD-10-CM | POA: Diagnosis present

## 2014-01-15 DIAGNOSIS — L03119 Cellulitis of unspecified part of limb: Secondary | ICD-10-CM

## 2014-01-15 LAB — CBC WITH DIFFERENTIAL/PLATELET
Basophils Absolute: 0 10*3/uL (ref 0.0–0.1)
Basophils Relative: 0 % (ref 0–1)
Eosinophils Absolute: 0.7 10*3/uL (ref 0.0–0.7)
Eosinophils Relative: 7 % — ABNORMAL HIGH (ref 0–5)
HEMATOCRIT: 37.9 % — AB (ref 39.0–52.0)
Hemoglobin: 13.5 g/dL (ref 13.0–17.0)
LYMPHS ABS: 1.6 10*3/uL (ref 0.7–4.0)
LYMPHS PCT: 16 % (ref 12–46)
MCH: 31.6 pg (ref 26.0–34.0)
MCHC: 35.6 g/dL (ref 30.0–36.0)
MCV: 88.8 fL (ref 78.0–100.0)
MONO ABS: 1.1 10*3/uL — AB (ref 0.1–1.0)
MONOS PCT: 11 % (ref 3–12)
NEUTROS ABS: 6.8 10*3/uL (ref 1.7–7.7)
Neutrophils Relative %: 66 % (ref 43–77)
Platelets: 223 10*3/uL (ref 150–400)
RBC: 4.27 MIL/uL (ref 4.22–5.81)
RDW: 12.3 % (ref 11.5–15.5)
WBC: 10.2 10*3/uL (ref 4.0–10.5)

## 2014-01-15 LAB — BASIC METABOLIC PANEL
Anion gap: 10 (ref 5–15)
BUN: 9 mg/dL (ref 6–23)
CALCIUM: 9.2 mg/dL (ref 8.4–10.5)
CO2: 28 mEq/L (ref 19–32)
Chloride: 98 mEq/L (ref 96–112)
Creatinine, Ser: 0.84 mg/dL (ref 0.50–1.35)
Glucose, Bld: 114 mg/dL — ABNORMAL HIGH (ref 70–99)
Potassium: 3.7 mEq/L (ref 3.7–5.3)
SODIUM: 136 meq/L — AB (ref 137–147)

## 2014-01-15 MED ORDER — HYDROMORPHONE HCL PF 1 MG/ML IJ SOLN: 2.0000 mg | Freq: Once | INTRAMUSCULAR | Status: AC

## 2014-01-15 MED ORDER — ONDANSETRON HCL 4 MG PO TABS
4.0000 mg | ORAL_TABLET | Freq: Four times a day (QID) | ORAL | Status: DC | PRN
Start: 2014-01-15 — End: 2014-01-17

## 2014-01-15 MED ORDER — CEFAZOLIN SODIUM-DEXTROSE 2-3 GM-% IV SOLR
2.0000 g | Freq: Three times a day (TID) | INTRAVENOUS | Status: DC
  Administered 2014-01-15 – 2014-01-17 (×5): 2 g via INTRAVENOUS
  Filled 2014-01-15 (×6): qty 50

## 2014-01-15 MED ORDER — ONDANSETRON HCL 4 MG/2ML IJ SOLN
4.0000 mg | Freq: Four times a day (QID) | INTRAMUSCULAR | Status: DC | PRN
Start: 2014-01-15 — End: 2014-01-17

## 2014-01-15 MED ORDER — ENOXAPARIN SODIUM 40 MG/0.4ML ~~LOC~~ SOLN
40.0000 mg | SUBCUTANEOUS | Status: DC
  Administered 2014-01-15 – 2014-01-16 (×2): 40 mg via SUBCUTANEOUS
  Filled 2014-01-15 (×4): qty 0.4

## 2014-01-15 MED ORDER — HYDROMORPHONE HCL PF 1 MG/ML IJ SOLN
INTRAMUSCULAR | Status: AC
  Administered 2014-01-15: 2 mg
  Filled 2014-01-15: qty 2

## 2014-01-15 MED ORDER — OXYCODONE-ACETAMINOPHEN 10-325 MG PO TABS: 1.0000 | ORAL_TABLET | ORAL | Status: DC | PRN

## 2014-01-15 MED ORDER — BISACODYL 10 MG RE SUPP: 10.0000 mg | Freq: Every day | RECTAL | Status: DC | PRN

## 2014-01-15 MED ORDER — OXYCODONE-ACETAMINOPHEN 5-325 MG PO TABS
1.0000 | ORAL_TABLET | ORAL | Status: DC | PRN
  Administered 2014-01-15 – 2014-01-17 (×8): 2 via ORAL
  Filled 2014-01-15 (×8): qty 2

## 2014-01-15 MED ORDER — HYDROMORPHONE HCL PF 1 MG/ML IJ SOLN
1.0000 mg | INTRAMUSCULAR | Status: DC | PRN
  Administered 2014-01-16: 2 mg via INTRAVENOUS
  Administered 2014-01-16: 1 mg via INTRAVENOUS
  Administered 2014-01-16 – 2014-01-17 (×4): 2 mg via INTRAVENOUS
  Filled 2014-01-15 (×6): qty 2
  Filled 2014-01-15: qty 1

## 2014-01-15 MED ORDER — SODIUM CHLORIDE 0.9 % IV SOLN
INTRAVENOUS | Status: DC
  Administered 2014-01-15: 18:00:00 via INTRAVENOUS

## 2014-01-15 MED ORDER — SODIUM CHLORIDE 0.9 % IV SOLN: INTRAVENOUS | Status: DC

## 2014-01-15 MED ORDER — OXYCODONE HCL 5 MG PO TABS
5.0000 mg | ORAL_TABLET | ORAL | Status: DC | PRN
  Administered 2014-01-15 – 2014-01-16 (×4): 10 mg via ORAL
  Administered 2014-01-16: 5 mg via ORAL
  Administered 2014-01-16 – 2014-01-17 (×3): 10 mg via ORAL
  Filled 2014-01-15 (×4): qty 2
  Filled 2014-01-15: qty 1
  Filled 2014-01-15 (×3): qty 2

## 2014-01-15 MED ORDER — DOCUSATE SODIUM 100 MG PO CAPS
100.0000 mg | ORAL_CAPSULE | Freq: Two times a day (BID) | ORAL | Status: DC
  Administered 2014-01-15 – 2014-01-17 (×4): 100 mg via ORAL
  Filled 2014-01-15 (×4): qty 1

## 2014-01-15 NOTE — ED Notes (Signed)
Report given to [place], EconomistN Charge [facility].
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
## 2014-01-15 NOTE — ED Notes (Signed)
Patient states home health changed his wound vac dressing today. Pt states he ate lunch and then notice increased swelling and redness to knee and lower leg. Hot to touch, painful to touch. Wound vac in place, set to 125 mm Hg.
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
## 2014-01-15 NOTE — ED Notes (Signed)
Patient left ED at this time with [facility] for transport to [facility]. 

## 2014-01-15 NOTE — ED Notes (Signed)
MD at bedside. 

## 2014-01-15 NOTE — ED Notes (Signed)
[facility]

## 2014-01-15 NOTE — ED Notes (Signed)
[facility] at bedside for transport to [place] ED. 
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
## 2014-01-16 DIAGNOSIS — D649 Anemia, unspecified: Secondary | ICD-10-CM | POA: Diagnosis present

## 2014-01-16 DIAGNOSIS — F17200 Nicotine dependence, unspecified, uncomplicated: Secondary | ICD-10-CM | POA: Diagnosis present

## 2014-01-16 DIAGNOSIS — M7989 Other specified soft tissue disorders: Secondary | ICD-10-CM

## 2014-01-16 DIAGNOSIS — M79609 Pain in unspecified limb: Secondary | ICD-10-CM

## 2014-01-16 DIAGNOSIS — L02419 Cutaneous abscess of limb, unspecified: Secondary | ICD-10-CM | POA: Diagnosis present
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
## 2014-01-16 NOTE — Progress Notes (Signed)
VASCULAR LAB PRELIMINARY  PRELIMINARY  PRELIMINARY  PRELIMINARY  Right lower extremity venous Doppler completed.    Preliminary report:  There is no DVT or SVT noted in the right lower extremity.  Enlarged lymph node noted in right groin.  Neiva Maenza, RVT 01/16/2014, 10:13 AM

## 2014-01-16 NOTE — Progress Notes (Signed)
Patient requesting Nicotine patch-has asked for it twice since 7pm. He says he is having strong cravings. Reports smoking about 5 cigarettes a day.  He had a 14mg  patch during his previous admission. MD paged.

## 2014-01-16 NOTE — Progress Notes (Signed)
I have seen and examined the pt and agree with PA-Jenning's progress note. Con't dressing changes Await final report of DOppler-prelim is no DVT Abx Likely home in AM

## 2014-01-17 MED ORDER — NICOTINE 7 MG/24HR TD PT24
7.0000 mg | MEDICATED_PATCH | Freq: Every day | TRANSDERMAL | Status: DC
  Filled 2014-01-17: qty 1

## 2014-01-17 MED ORDER — OXYCODONE-ACETAMINOPHEN 10-325 MG PO TABS: 1.0000 | ORAL_TABLET | ORAL | Status: DC | PRN

## 2014-01-17 NOTE — Discharge Instructions (Signed)
Dressing Change You need to do the wet to dry dressing change twice a day.   Remove old dressing.  Then repack the wound with sterile wet 4 x 4's.  Use sterile saline to wet the 4 x 4's.  Cover with ABD pad and Kerlix.  Do the dressing change twice a day.  A dressing is a material placed over wounds. It keeps the wound clean, dry, and protected from further injury.  BEFORE YOU BEGIN  Get your supplies together. Things you may need include:  Salt solution (saline).  Flexible gauze bandage.  Medicated cream.  Tape.  Gloves.  Belly (abdominal) pads.  Gauze squares.  Plastic bags.  Take pain medicine 30 minutes before the bandage change if you need it.  Take a shower before you do the first bandage change of the day. Put plastic wrap or a bag over the dressing. REMOVING YOUR OLD BANDAGE  Wash your hands with soap and water. Dry your hands with a clean towel.  Put on your gloves.  Remove any tape.  Remove the old bandage as told. If it sticks, put a small amount of warm water on it to loosen the bandage.  Remove any gauze or packing tape in your wound.  Take off your gloves.  Put the gloves, tape, gauze, or any packing tape in a plastic bag. CHANGING YOUR BANDAGE  Open the supplies.  Take the cap off the salt solution.  Open the gauze. Leave the gauze on the inside of the package.  Put on your gloves.  Clean your wound as told by your doctor.  Keep your wound dry if your doctor told you to do so.  Your doctor may tell you to do one or more of the following:  Pick up the gauze. Pour the salt solution over the gauze. Squeeze out the extra salt solution.  Put medicated cream or other medicine on your wound.  Put solution soaked gauze only in your wound, not on the skin around it.  Pack your wound loosely.  Put dry gauze on your wound.  Put belly pads over the dry gauze if your bandages soak through.  Tape the bandage in place so it will not fall off. Do  not wrap the tape all the way around your arm or leg.  Wrap the bandage with the flexibe gauze bandage as told by your doctor.  Take off your gloves. Put them in the plastic bag with the old bandage. Tie the bag shut and throw it away.  Keep the bandage clean and dry.  Wash your hands. GET HELP RIGHT AWAY IF:   Your skin around the wound looks red.  Your wound feels more tender or sore.  You see yellowish-white fluid (pus) in the wound.  Your wound smells bad.  You have a fever.  Your skin around the wound has a red rash that itches and burns.  You see black or yellow skin in your wound that was not there before.  You feel sick to your stomach (nauseous), throw up (vomit), and feel very tired. Document Released: 09/14/2008 Document Revised: 09/10/2011 Document Reviewed: 04/29/2011 [facility] Patient Information 2015 [place]. This information is not intended to replace advice given to you by your health care provider. Make sure you discuss any questions you have with your health care provider.  STOP SMOKING, this slows wound healing.  Smoking Cessation Quitting smoking is important to your health and has many advantages. However, it is not always easy to  quit since nicotine is a very addictive drug. Often times, people try 3 times or more before being able to quit. This document explains the best ways for you to prepare to quit smoking. Quitting takes hard work and a lot of effort, but you can do it. ADVANTAGES OF QUITTING SMOKING  You will live longer, feel better, and live better.  Your body will feel the impact of quitting smoking almost immediately.  Within 20 minutes, blood pressure decreases. Your pulse returns to its normal level.  After 8 hours, carbon monoxide levels in the blood return to normal. Your oxygen level increases.  After 24 hours, the chance of having a heart attack starts to decrease. Your breath, hair, and body stop smelling like smoke.  After  48 hours, damaged nerve endings begin to recover. Your sense of taste and smell improve.  After 72 hours, the body is virtually free of nicotine. Your bronchial tubes relax and breathing becomes easier.  After 2 to 12 weeks, lungs can hold more air. Exercise becomes easier and circulation improves.  The risk of having a heart attack, stroke, cancer, or lung disease is greatly reduced.  After 1 year, the risk of coronary heart disease is cut in half.  After 5 years, the risk of stroke falls to the same as a nonsmoker.  After 10 years, the risk of lung cancer is cut in half and the risk of other cancers decreases significantly.  After 15 years, the risk of coronary heart disease drops, usually to the level of a nonsmoker.  If you are pregnant, quitting smoking will improve your chances of having a healthy baby.  The people you live with, especially any children, will be healthier.  You will have extra money to spend on things other than cigarettes. QUESTIONS TO THINK ABOUT BEFORE ATTEMPTING TO QUIT You may want to talk about your answers with your caregiver.  Why do you want to quit?  If you tried to quit in the past, what helped and what did not?  What will be the most difficult situations for you after you quit? How will you plan to handle them?  Who can help you through the tough times? Your family? Friends? A caregiver?  What pleasures do you get from smoking? What ways can you still get pleasure if you quit? Here are some questions to ask your caregiver:  How can you help me to be successful at quitting?  What medicine do you think would be best for me and how should I take it?  What should I do if I need more help?  What is smoking withdrawal like? How can I get information on withdrawal? GET READY  Set a quit date.  Change your environment by getting rid of all cigarettes, ashtrays, matches, and lighters in your home, car, or work. Do not let people smoke in your  home.  Review your past attempts to quit. Think about what worked and what did not. GET SUPPORT AND ENCOURAGEMENT You have a better chance of being successful if you have help. You can get support in many ways.  Tell your family, friends, and co-workers that you are going to quit and need their support. Ask them not to smoke around you.  Get individual, group, or telephone counseling and support. Programs are available at [employer] and health centers. Call your local health department for information about programs in your area.  Spiritual beliefs and practices may help some smokers quit.  Download a "  quit meter" on your computer to keep track of quit statistics, such as how long you have gone without smoking, cigarettes not smoked, and money saved.  Get a self-help book about quitting smoking and staying off of tobacco. LEARN NEW SKILLS AND BEHAVIORS  Distract yourself from urges to smoke. Talk to someone, go for a walk, or occupy your time with a task.  Change your normal routine. Take a different route to work. Drink tea instead of coffee. Eat breakfast in a different place.  Reduce your stress. Take a hot bath, exercise, or read a book.  Plan something enjoyable to do every day. Reward yourself for not smoking.  Explore interactive web-based programs that specialize in helping you quit. GET MEDICINE AND USE IT CORRECTLY Medicines can help you stop smoking and decrease the urge to smoke. Combining medicine with the above behavioral methods and support can greatly increase your chances of successfully quitting smoking.  Nicotine replacement therapy helps deliver nicotine to your body without the negative effects and risks of smoking. Nicotine replacement therapy includes nicotine gum, lozenges, inhalers, nasal sprays, and skin patches. Some may be available over-the-counter and others require a prescription.  Antidepressant medicine helps people abstain from smoking, but how  this works is unknown. This medicine is available by prescription.  Nicotinic receptor partial agonist medicine simulates the effect of nicotine in your brain. This medicine is available by prescription. Ask your caregiver for advice about which medicines to use and how to use them based on your health history. Your caregiver will tell you what side effects to look out for if you choose to be on a medicine or therapy. Carefully read the information on the package. Do not use any other product containing nicotine while using a nicotine replacement product.  RELAPSE OR DIFFICULT SITUATIONS Most relapses occur within the first 3 months after quitting. Do not be discouraged if you start smoking again. Remember, most people try several times before finally quitting. You may have symptoms of withdrawal because your body is used to nicotine. You may crave cigarettes, be irritable, feel very hungry, cough often, get headaches, or have difficulty concentrating. The withdrawal symptoms are only temporary. They are strongest when you first quit, but they will go away within 10-14 days. To reduce the chances of relapse, try to:  Avoid drinking alcohol. Drinking lowers your chances of successfully quitting.  Reduce the amount of caffeine you consume. Once you quit smoking, the amount of caffeine in your body increases and can give you symptoms, such as a rapid heartbeat, sweating, and anxiety.  Avoid smokers because they can make you want to smoke.  Do not let weight gain distract you. Many smokers will gain weight when they quit, usually less than 10 pounds. Eat a healthy diet and stay active. You can always lose the weight gained after you quit.  Find ways to improve your mood other than smoking. FOR MORE INFORMATION  www.smokefree.gov  Document Released: 06/12/2001 Document Revised: 12/18/2011 Document Reviewed: 09/27/2011 [facility] Patient Information 2015 [place], [place]. This information is not intended  to replace advice given to you by your health care provider. Make sure you discuss any questions you have with your health care provider.
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
## 2014-01-17 NOTE — Progress Notes (Signed)
Pt seen and examined  The mild cellulitis in calf area has resolved.  No DVT on u/s  Can discharge today No need for abx  Timothy Ayala M. [name], MD, FACS General, Bariatric, & Minimally Invasive Surgery [facility] [place]
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
## 2014-01-17 NOTE — Discharge Summary (Signed)
Lennan Malone M. Resnik, MD, FACS General, Bariatric, & Minimally Invasive Surgery [facility] [place]  

## 2014-01-18 ENCOUNTER — Telehealth (HOSPITAL_COMMUNITY): Payer: Self-pay

## 2014-01-18 NOTE — Telephone Encounter (Signed)
Left message summarizing ED visit.

## 2014-01-20 ENCOUNTER — Encounter (INDEPENDENT_AMBULATORY_CARE_PROVIDER_SITE_OTHER): Payer: Self-pay

## 2014-01-20 ENCOUNTER — Ambulatory Visit (INDEPENDENT_AMBULATORY_CARE_PROVIDER_SITE_OTHER): Payer: No Typology Code available for payment source | Admitting: General Surgery

## 2014-01-20 VITALS — BP 150/88 | HR 72 | Temp 98.7°F | Resp 14 | Ht 73.5 in | Wt 214.0 lb

## 2014-01-20 DIAGNOSIS — S71101S Unspecified open wound, right thigh, sequela: Secondary | ICD-10-CM

## 2014-01-20 DIAGNOSIS — IMO0002 Reserved for concepts with insufficient information to code with codable children: Secondary | ICD-10-CM

## 2014-01-20 DIAGNOSIS — W3400XS Accidental discharge from unspecified firearms or gun, sequela: Secondary | ICD-10-CM

## 2014-01-20 DIAGNOSIS — M7989 Other specified soft tissue disorders: Secondary | ICD-10-CM

## 2014-01-20 DIAGNOSIS — Y33XXXS Other specified events, undetermined intent, sequela: Secondary | ICD-10-CM

## 2014-01-20 DIAGNOSIS — S71001S Unspecified open wound, right hip, sequela: Secondary | ICD-10-CM

## 2014-01-20 DIAGNOSIS — S71131S Puncture wound without foreign body, right thigh, sequela: Principal | ICD-10-CM

## 2014-01-20 MED ORDER — TRAMADOL HCL 50 MG PO TABS: 50.0000 mg | ORAL_TABLET | Freq: Four times a day (QID) | ORAL | Status: DC | PRN
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
## 2014-01-20 NOTE — Patient Instructions (Signed)
-  Your percocet prescription will be ready to pick up by 01/24/14 (you were given a 14 day supply) -Start to wean your pain medication to ibuprofen or tylenol -I will give your Ultram to use in the mean time until you can pick up your percocets on 7/26 -Try ice over the area -Resume wound vacuum with vac changes M/W/F -Get in the shower to cleans the area prior to new wound vac going on -Have the nurse show you how to take the wound vac off prior to her coming

## 2014-02-03 ENCOUNTER — Other Ambulatory Visit (INDEPENDENT_AMBULATORY_CARE_PROVIDER_SITE_OTHER): Payer: Self-pay | Admitting: *Deleted

## 2014-02-03 ENCOUNTER — Encounter (INDEPENDENT_AMBULATORY_CARE_PROVIDER_SITE_OTHER): Payer: Self-pay

## 2014-02-03 ENCOUNTER — Ambulatory Visit (INDEPENDENT_AMBULATORY_CARE_PROVIDER_SITE_OTHER): Payer: No Typology Code available for payment source | Admitting: General Surgery

## 2014-02-03 VITALS — BP 120/70 | HR 72 | Temp 98.2°F | Resp 14 | Ht 73.0 in | Wt 223.8 lb

## 2014-02-03 DIAGNOSIS — S71009A Unspecified open wound, unspecified hip, initial encounter: Secondary | ICD-10-CM

## 2014-02-03 DIAGNOSIS — IMO0002 Reserved for concepts with insufficient information to code with codable children: Secondary | ICD-10-CM

## 2014-02-03 DIAGNOSIS — W3400XS Accidental discharge from unspecified firearms or gun, sequela: Secondary | ICD-10-CM

## 2014-02-03 DIAGNOSIS — S71109A Unspecified open wound, unspecified thigh, initial encounter: Secondary | ICD-10-CM

## 2014-02-03 MED ORDER — HYDROCODONE-ACETAMINOPHEN 5-325 MG PO TABS: 1.0000 | ORAL_TABLET | Freq: Three times a day (TID) | ORAL | Status: DC | PRN
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
## 2014-02-03 NOTE — Patient Instructions (Signed)

## 2014-02-24 ENCOUNTER — Ambulatory Visit (INDEPENDENT_AMBULATORY_CARE_PROVIDER_SITE_OTHER): Payer: No Typology Code available for payment source | Admitting: General Surgery

## 2014-02-24 VITALS — BP 130/84 | HR 70 | Temp 97.9°F | Ht 74.0 in | Wt 214.5 lb

## 2014-02-24 DIAGNOSIS — IMO0002 Reserved for concepts with insufficient information to code with codable children: Secondary | ICD-10-CM

## 2014-02-24 DIAGNOSIS — S71101S Unspecified open wound, right thigh, sequela: Secondary | ICD-10-CM

## 2014-02-24 DIAGNOSIS — Y33XXXS Other specified events, undetermined intent, sequela: Secondary | ICD-10-CM

## 2014-02-24 DIAGNOSIS — S71131S Puncture wound without foreign body, right thigh, sequela: Secondary | ICD-10-CM

## 2014-02-24 DIAGNOSIS — S71001S Unspecified open wound, right hip, sequela: Secondary | ICD-10-CM

## 2014-02-24 DIAGNOSIS — W3400XS Accidental discharge from unspecified firearms or gun, sequela: Secondary | ICD-10-CM

## 2014-02-24 MED ORDER — ACETAMINOPHEN 500 MG PO TABS: 500.0000 mg | ORAL_TABLET | Freq: Four times a day (QID) | ORAL | Status: AC | PRN

## 2014-02-24 MED ORDER — IBUPROFEN 200 MG PO TABS: 800.0000 mg | ORAL_TABLET | Freq: Three times a day (TID) | ORAL | Status: AC | PRN
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
## 2014-02-24 NOTE — Patient Instructions (Signed)
Okay to take motrin/tylenol as prescribed Motrin  3 times a day as needed for pain Tylenol  up to 4 times a day as needed for pain *alternate medications to help in pain control  Ice Start stretching and exercising Can return to work Discontinue crutchs

## 2014-03-28 ENCOUNTER — Encounter (HOSPITAL_COMMUNITY): Payer: Self-pay | Admitting: Emergency Medicine

## 2014-03-28 ENCOUNTER — Emergency Department (HOSPITAL_COMMUNITY)
Admission: EM | Admit: 2014-03-28 | Discharge: 2014-03-28 | Disposition: A | Discharge status: Home / Self Care | Payer: Self-pay | Attending: Emergency Medicine | Admitting: Emergency Medicine

## 2014-03-28 DIAGNOSIS — R300 Dysuria: Secondary | ICD-10-CM | POA: Insufficient documentation

## 2014-03-28 DIAGNOSIS — M25561 Pain in right knee: Secondary | ICD-10-CM

## 2014-03-28 DIAGNOSIS — Z79899 Other long term (current) drug therapy: Secondary | ICD-10-CM | POA: Insufficient documentation

## 2014-03-28 DIAGNOSIS — F17200 Nicotine dependence, unspecified, uncomplicated: Secondary | ICD-10-CM | POA: Insufficient documentation

## 2014-03-28 DIAGNOSIS — M25569 Pain in unspecified knee: Secondary | ICD-10-CM | POA: Insufficient documentation

## 2014-03-28 LAB — URINALYSIS, ROUTINE W REFLEX MICROSCOPIC
Bilirubin Urine: NEGATIVE
GLUCOSE, UA: NEGATIVE mg/dL
Hgb urine dipstick: NEGATIVE
KETONES UR: NEGATIVE mg/dL
Leukocytes, UA: NEGATIVE
Nitrite: NEGATIVE
PROTEIN: NEGATIVE mg/dL
Specific Gravity, Urine: 1.015 (ref 1.005–1.030)
Urobilinogen, UA: 1 mg/dL (ref 0.0–1.0)
pH: 6.5 (ref 5.0–8.0)

## 2014-03-28 LAB — BASIC METABOLIC PANEL
Anion gap: 10 (ref 5–15)
BUN: 7 mg/dL (ref 6–23)
CO2: 28 meq/L (ref 19–32)
CREATININE: 1.06 mg/dL (ref 0.50–1.35)
Calcium: 9.4 mg/dL (ref 8.4–10.5)
Chloride: 102 mEq/L (ref 96–112)
GFR calc Af Amer: >90 mL/min (ref >90)
GFR calc non Af Amer: >90 mL/min (ref >90)
Glucose, Bld: 100 mg/dL — ABNORMAL HIGH (ref 70–99)
Potassium: 3.8 mEq/L (ref 3.7–5.3)
Sodium: 140 mEq/L (ref 137–147)

## 2014-03-28 MED ORDER — LIDOCAINE HCL (PF) 1 % IJ SOLN
INTRAMUSCULAR | Status: AC
  Filled 2014-03-28: qty 5

## 2014-03-28 MED ORDER — AZITHROMYCIN 250 MG PO TABS
1000.0000 mg | ORAL_TABLET | Freq: Once | ORAL | Status: AC
  Administered 2014-03-28: 1000 mg via ORAL
  Filled 2014-03-28: qty 4

## 2014-03-28 MED ORDER — CEFTRIAXONE SODIUM 250 MG IJ SOLR
250.0000 mg | Freq: Once | INTRAMUSCULAR | Status: AC
  Administered 2014-03-28: 250 mg via INTRAMUSCULAR
  Filled 2014-03-28: qty 250

## 2014-03-28 MED ORDER — HYDROCODONE-ACETAMINOPHEN 5-325 MG PO TABS: ORAL_TABLET | ORAL | Status: DC
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
## 2014-03-28 NOTE — Discharge Instructions (Signed)
Knee Pain °Knee pain can be a result of an injury or other medical conditions. Treatment will depend on the cause of your pain. °HOME CARE °· Only take medicine as told by your doctor. °· Keep a healthy weight. Being overweight can make the knee hurt more. °· Stretch before exercising or playing sports. °· If there is constant knee pain, change the way you exercise. Ask your doctor for advice. °· Make sure shoes fit well. Choose the right shoe for the sport or activity. °· Protect your knees. Wear kneepads if needed. °· Rest when you are tired. °GET HELP RIGHT AWAY IF:  °· Your knee pain does not stop. °· Your knee pain does not get better. °· Your knee joint feels hot to the touch. °· You have a fever. °MAKE SURE YOU:  °· Understand these instructions. °· Will watch this condition. °· Will get help right away if you are not doing well or get worse. °Document Released: 09/14/2008 Document Revised: 09/10/2011 Document Reviewed: 09/14/2008 °ExitCare® Patient Information ©2015 [employer]. This information is not intended to replace advice given to you by your health care provider. Make sure you discuss any questions you have with your health care provider. ° °

## 2014-03-29 LAB — HIV ANTIBODY (ROUTINE TESTING W REFLEX): HIV 1&2 Ab, 4th Generation: NONREACTIVE

## 2014-03-29 LAB — SYPHILIS: RPR W/REFLEX TO RPR TITER AND TREPONEMAL ANTIBODIES, TRADITIONAL SCREENING AND DIAGNOSIS ALGORITHM

## 2014-03-30 LAB — GC/CHLAMYDIA PROBE AMP
CT PROBE, AMP APTIMA: NEGATIVE
GC PROBE AMP APTIMA: NEGATIVE

## 2014-03-30 NOTE — ED Provider Notes (Signed)
Medical screening examination/treatment/procedure(s) were performed by non-physician practitioner and as supervising physician I was immediately available for consultation/collaboration.   EKG Interpretation None       Kwan Shellhammer, MD 03/30/14 2305 

## 2015-06-01 ENCOUNTER — Encounter (HOSPITAL_COMMUNITY): Payer: Self-pay | Admitting: *Deleted

## 2015-06-01 ENCOUNTER — Emergency Department (HOSPITAL_COMMUNITY)
Admission: EM | Admit: 2015-06-01 | Discharge: 2015-06-01 | Disposition: A | Discharge status: Home / Self Care | Payer: Self-pay | Attending: Emergency Medicine | Admitting: Emergency Medicine

## 2015-06-01 DIAGNOSIS — F17210 Nicotine dependence, cigarettes, uncomplicated: Secondary | ICD-10-CM | POA: Insufficient documentation

## 2015-06-01 DIAGNOSIS — M25561 Pain in right knee: Secondary | ICD-10-CM | POA: Insufficient documentation

## 2015-06-01 DIAGNOSIS — G8929 Other chronic pain: Secondary | ICD-10-CM | POA: Insufficient documentation

## 2015-06-01 MED ORDER — DEXAMETHASONE 4 MG PO TABS: 4.0000 mg | ORAL_TABLET | Freq: Two times a day (BID) | ORAL | Status: DC

## 2015-06-01 MED ORDER — PREDNISONE 50 MG PO TABS
60.0000 mg | ORAL_TABLET | Freq: Once | ORAL | Status: AC
  Administered 2015-06-01: 60 mg via ORAL
  Filled 2015-06-01: qty 1

## 2015-06-01 MED ORDER — PROMETHAZINE HCL 12.5 MG PO TABS
12.5000 mg | ORAL_TABLET | Freq: Once | ORAL | Status: AC
Start: 2015-06-01 — End: 2015-06-01
  Administered 2015-06-01: 12.5 mg via ORAL
  Filled 2015-06-01: qty 1

## 2015-06-01 MED ORDER — DICLOFENAC SODIUM 75 MG PO TBEC: 75.0000 mg | DELAYED_RELEASE_TABLET | Freq: Two times a day (BID) | ORAL | Status: DC

## 2015-06-01 MED ORDER — KETOROLAC TROMETHAMINE 10 MG PO TABS
10.0000 mg | ORAL_TABLET | Freq: Once | ORAL | Status: AC
  Administered 2015-06-01: 10 mg via ORAL
  Filled 2015-06-01: qty 1
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
## 2016-01-31 ENCOUNTER — Emergency Department (HOSPITAL_COMMUNITY)
Admission: EM | Admit: 2016-01-31 | Discharge: 2016-01-31 | Disposition: A | Discharge status: Home / Self Care | Payer: Self-pay | Attending: Emergency Medicine | Admitting: Emergency Medicine

## 2016-01-31 ENCOUNTER — Encounter (HOSPITAL_COMMUNITY): Payer: Self-pay

## 2016-01-31 ENCOUNTER — Emergency Department (HOSPITAL_COMMUNITY): Payer: Self-pay

## 2016-01-31 DIAGNOSIS — M25561 Pain in right knee: Secondary | ICD-10-CM | POA: Insufficient documentation

## 2016-01-31 DIAGNOSIS — F17210 Nicotine dependence, cigarettes, uncomplicated: Secondary | ICD-10-CM | POA: Insufficient documentation

## 2016-01-31 DIAGNOSIS — Z9889 Other specified postprocedural states: Secondary | ICD-10-CM | POA: Insufficient documentation

## 2016-01-31 HISTORY — DX: Unspecified injury of unspecified lower leg, initial encounter: S89.90XA

## 2016-01-31 MED ORDER — TRAMADOL HCL 50 MG PO TABS
50.0000 mg | ORAL_TABLET | Freq: Once | ORAL | Status: AC
  Administered 2016-01-31: 50 mg via ORAL
  Filled 2016-01-31: qty 1

## 2016-01-31 MED ORDER — DICLOFENAC SODIUM 75 MG PO TBEC: 75.0000 mg | DELAYED_RELEASE_TABLET | Freq: Two times a day (BID) | ORAL | 0 refills | Status: DC
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
## 2016-07-23 ENCOUNTER — Emergency Department (HOSPITAL_COMMUNITY)
Admission: EM | Admit: 2016-07-23 | Discharge: 2016-07-24 | Disposition: A | Discharge status: Home / Self Care | Payer: Self-pay | Attending: Emergency Medicine | Admitting: Emergency Medicine

## 2016-07-23 ENCOUNTER — Emergency Department (HOSPITAL_COMMUNITY): Payer: Self-pay

## 2016-07-23 ENCOUNTER — Encounter (HOSPITAL_COMMUNITY): Payer: Self-pay

## 2016-07-23 DIAGNOSIS — R52 Pain, unspecified: Secondary | ICD-10-CM

## 2016-07-23 DIAGNOSIS — N3000 Acute cystitis without hematuria: Secondary | ICD-10-CM

## 2016-07-23 DIAGNOSIS — R109 Unspecified abdominal pain: Secondary | ICD-10-CM | POA: Insufficient documentation

## 2016-07-23 DIAGNOSIS — F17210 Nicotine dependence, cigarettes, uncomplicated: Secondary | ICD-10-CM | POA: Insufficient documentation

## 2016-07-23 LAB — URINALYSIS, ROUTINE W REFLEX MICROSCOPIC
GLUCOSE, UA: NEGATIVE mg/dL
Hgb urine dipstick: NEGATIVE
KETONES UR: 5 mg/dL — AB
NITRITE: NEGATIVE
PH: 5 (ref 5.0–8.0)
PROTEIN: 30 mg/dL — AB
Specific Gravity, Urine: 1.03 (ref 1.005–1.030)

## 2016-07-23 LAB — CBC WITH DIFFERENTIAL/PLATELET
Basophils Absolute: 0 10*3/uL (ref 0.0–0.1)
Basophils Relative: 0 %
EOS ABS: 0.2 10*3/uL (ref 0.0–0.7)
EOS PCT: 2 %
HCT: 37 % — ABNORMAL LOW (ref 39.0–52.0)
HEMOGLOBIN: 13.1 g/dL (ref 13.0–17.0)
LYMPHS PCT: 11 %
Lymphs Abs: 1.7 10*3/uL (ref 0.7–4.0)
MCH: 32 pg (ref 26.0–34.0)
MCHC: 35.4 g/dL (ref 30.0–36.0)
MCV: 90.5 fL (ref 78.0–100.0)
MONOS PCT: 7 %
Monocytes Absolute: 1.2 10*3/uL — ABNORMAL HIGH (ref 0.1–1.0)
NEUTROS PCT: 80 %
Neutro Abs: 12.6 10*3/uL — ABNORMAL HIGH (ref 1.7–7.7)
Platelets: 236 10*3/uL (ref 150–400)
RBC: 4.09 MIL/uL — AB (ref 4.22–5.81)
RDW: 12.8 % (ref 11.5–15.5)
WBC: 15.7 10*3/uL — ABNORMAL HIGH (ref 4.0–10.5)

## 2016-07-23 LAB — COMPREHENSIVE METABOLIC PANEL
ALK PHOS: 39 U/L (ref 38–126)
ALT: 15 U/L — AB (ref 17–63)
ANION GAP: 8 (ref 5–15)
AST: 19 U/L (ref 15–41)
Albumin: 4.1 g/dL (ref 3.5–5.0)
BILIRUBIN TOTAL: 0.4 mg/dL (ref 0.3–1.2)
BUN: 10 mg/dL (ref 6–20)
CALCIUM: 9.2 mg/dL (ref 8.9–10.3)
CO2: 25 mmol/L (ref 22–32)
CREATININE: 1.04 mg/dL (ref 0.61–1.24)
Chloride: 104 mmol/L (ref 101–111)
GFR calc non Af Amer: >60 mL/min (ref >60)
Glucose, Bld: 95 mg/dL (ref 65–99)
Potassium: 3.3 mmol/L — ABNORMAL LOW (ref 3.5–5.1)
SODIUM: 137 mmol/L (ref 135–145)
TOTAL PROTEIN: 7 g/dL (ref 6.5–8.1)

## 2016-07-23 MED ORDER — TRAMADOL HCL 50 MG PO TABS: 50.0000 mg | ORAL_TABLET | Freq: Four times a day (QID) | ORAL | 0 refills | Status: DC | PRN

## 2016-07-23 MED ORDER — CIPROFLOXACIN HCL 500 MG PO TABS: 500.0000 mg | ORAL_TABLET | Freq: Two times a day (BID) | ORAL | 0 refills | Status: DC

## 2016-07-23 MED ORDER — HYDROMORPHONE HCL 1 MG/ML IJ SOLN
1.0000 mg | Freq: Once | INTRAMUSCULAR | Status: AC
  Administered 2016-07-23: 1 mg via INTRAVENOUS

## 2016-07-23 MED ORDER — ONDANSETRON HCL 4 MG/2ML IJ SOLN
4.0000 mg | Freq: Once | INTRAMUSCULAR | Status: AC
  Administered 2016-07-23: 4 mg via INTRAVENOUS
  Filled 2016-07-23: qty 2

## 2016-07-23 MED ORDER — HYDROMORPHONE HCL 1 MG/ML IJ SOLN
1.0000 mg | Freq: Once | INTRAMUSCULAR | Status: AC
  Administered 2016-07-23: 1 mg via INTRAVENOUS
  Filled 2016-07-23: qty 1

## 2016-07-23 MED ORDER — HYDROMORPHONE HCL 2 MG/ML IJ SOLN
INTRAMUSCULAR | Status: AC
  Filled 2016-07-23: qty 1

## 2016-07-23 MED ORDER — KETOROLAC TROMETHAMINE 30 MG/ML IJ SOLN
30.0000 mg | Freq: Once | INTRAMUSCULAR | Status: AC
  Administered 2016-07-23: 30 mg via INTRAVENOUS
  Filled 2016-07-23: qty 1

## 2016-07-23 NOTE — Discharge Instructions (Signed)
[place] [facility] [place]. Follow-up with Dr. Romeo AppleHarrison for your knee

## 2016-07-23 NOTE — ED Triage Notes (Signed)
My right testicle is hurting and it feels like pressure in my back.  Started this morning with increasing pain throughout the day.  Back pain is located in right beside my spine.  Denies blood in urine or difficulty starting urination.  Patient states that sometimes he feels like he needs to urinate badly, but does not urinate that much.  Feels like there is swelling in my testicle.

## 2016-07-23 NOTE — ED Notes (Signed)
Patient given ginger ale at this time. Attempting to obtain urine specimen.
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
## 2016-07-23 NOTE — ED Notes (Signed)
Patient asking for Ginger ale or coffee at this time. Reminded that urine specimen is needed.

## 2016-07-23 NOTE — ED Notes (Signed)
Patient ambulatory to restroom.Patient was given pain medication. Patient reminded to wear shoes and not walk in socks. Patient preceded to restroom without shoes.

## 2016-07-25 LAB — URINE CULTURE

## 2016-09-10 ENCOUNTER — Encounter (HOSPITAL_COMMUNITY): Payer: Self-pay | Admitting: Emergency Medicine

## 2016-09-10 ENCOUNTER — Emergency Department (HOSPITAL_COMMUNITY)
Admission: EM | Admit: 2016-09-10 | Discharge: 2016-09-10 | Disposition: A | Discharge status: Home / Self Care | Payer: Self-pay | Attending: Emergency Medicine | Admitting: Emergency Medicine

## 2016-09-10 DIAGNOSIS — Z79899 Other long term (current) drug therapy: Secondary | ICD-10-CM | POA: Insufficient documentation

## 2016-09-10 DIAGNOSIS — Z87891 Personal history of nicotine dependence: Secondary | ICD-10-CM | POA: Insufficient documentation

## 2016-09-10 DIAGNOSIS — M545 Low back pain: Secondary | ICD-10-CM | POA: Insufficient documentation

## 2016-09-10 MED ORDER — NAPROXEN 375 MG PO TABS: 375.0000 mg | ORAL_TABLET | Freq: Two times a day (BID) | ORAL | 0 refills | Status: DC | PRN

## 2016-09-10 MED ORDER — LORAZEPAM 0.5 MG PO TABS
0.5000 mg | ORAL_TABLET | Freq: Once | ORAL | Status: AC
  Administered 2016-09-10: 0.5 mg via ORAL
  Filled 2016-09-10: qty 1

## 2016-09-10 MED ORDER — KETOROLAC TROMETHAMINE 60 MG/2ML IM SOLN
30.0000 mg | Freq: Once | INTRAMUSCULAR | Status: AC
  Administered 2016-09-10: 30 mg via INTRAMUSCULAR
  Filled 2016-09-10: qty 2

## 2016-09-10 MED ORDER — TRAMADOL HCL 50 MG PO TABS: 50.0000 mg | ORAL_TABLET | Freq: Four times a day (QID) | ORAL | 0 refills | Status: DC | PRN

## 2016-09-10 MED ORDER — HYDROMORPHONE HCL 1 MG/ML IJ SOLN
1.0000 mg | Freq: Once | INTRAMUSCULAR | Status: AC
Start: 2016-09-10 — End: 2016-09-10
  Administered 2016-09-10: 1 mg via INTRAMUSCULAR
  Filled 2016-09-10: qty 1
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
## 2017-02-28 ENCOUNTER — Emergency Department (HOSPITAL_COMMUNITY)
Admission: EM | Admit: 2017-02-28 | Discharge: 2017-02-28 | Disposition: A | Discharge status: Home / Self Care | Payer: Self-pay | Attending: Emergency Medicine | Admitting: Emergency Medicine

## 2017-02-28 ENCOUNTER — Encounter (HOSPITAL_COMMUNITY): Payer: Self-pay | Admitting: Emergency Medicine

## 2017-02-28 DIAGNOSIS — F17210 Nicotine dependence, cigarettes, uncomplicated: Secondary | ICD-10-CM | POA: Insufficient documentation

## 2017-02-28 DIAGNOSIS — K047 Periapical abscess without sinus: Secondary | ICD-10-CM | POA: Insufficient documentation

## 2017-02-28 MED ORDER — AMOXICILLIN 500 MG PO CAPS: 500.0000 mg | ORAL_CAPSULE | Freq: Three times a day (TID) | ORAL | 0 refills | Status: DC

## 2017-02-28 MED ORDER — DICLOFENAC SODIUM 50 MG PO TBEC: 50.0000 mg | DELAYED_RELEASE_TABLET | Freq: Two times a day (BID) | ORAL | 0 refills | Status: DC

## 2017-02-28 NOTE — ED Notes (Signed)
Pt has been taking 1200mg  ibuprofen at a time, states pain radiating into his ear

## 2017-02-28 NOTE — ED Notes (Signed)
Patient given discharge instruction, verbalized understand. Patient ambulatory out of the department.  
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
## 2017-02-28 NOTE — Discharge Instructions (Signed)
Return if any problems.

## 2017-02-28 NOTE — ED Triage Notes (Signed)
Broken tooth on bottom left with pain.
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
## 2017-08-18 IMAGING — US US ART/VEN ABD/PELV/SCROTUM DOPPLER LTD
1 series · 14 of 25 positions shown · non-contrast
Comparison: Abdominal CT dated 04/16/2013

CLINICAL DATA: 25-year-old male with right testicular pain and
dysuria.

EXAM:
SCROTAL ULTRASOUND
DOPPLER ULTRASOUND OF THE TESTICLES
TECHNIQUE: Complete ultrasound examination of the testicles, epididymis, and
other scrotal structures was performed. Color and spectral Doppler
ultrasound were also utilized to evaluate blood flow to the
testicles.

[Series 1: us art/ven abd/pelv/scrotum doppler ltd · 0.06mm/px · 14 of 62 slices shown]
[im 1/62]
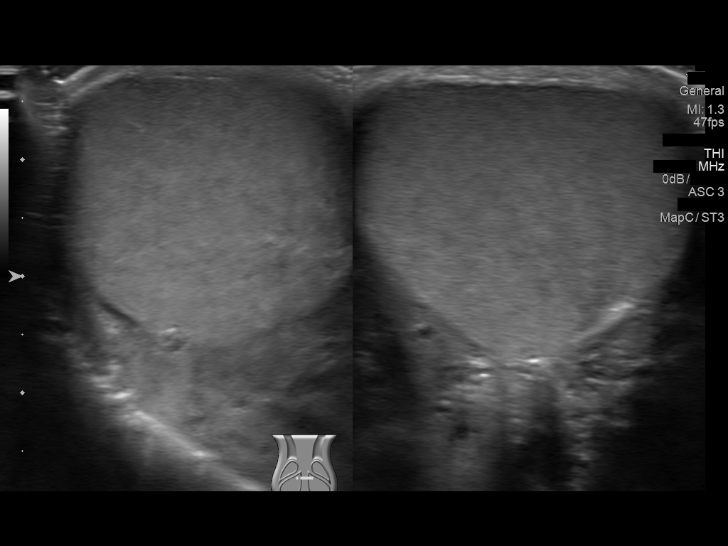
[im 6/62]
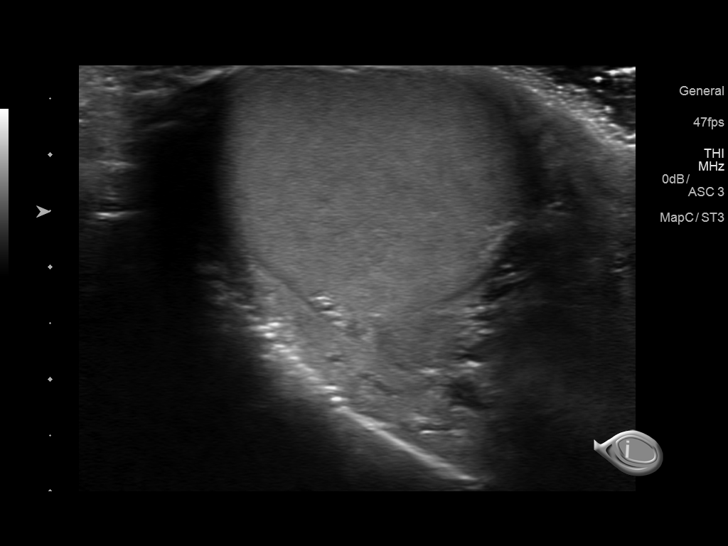
[im 11/62]
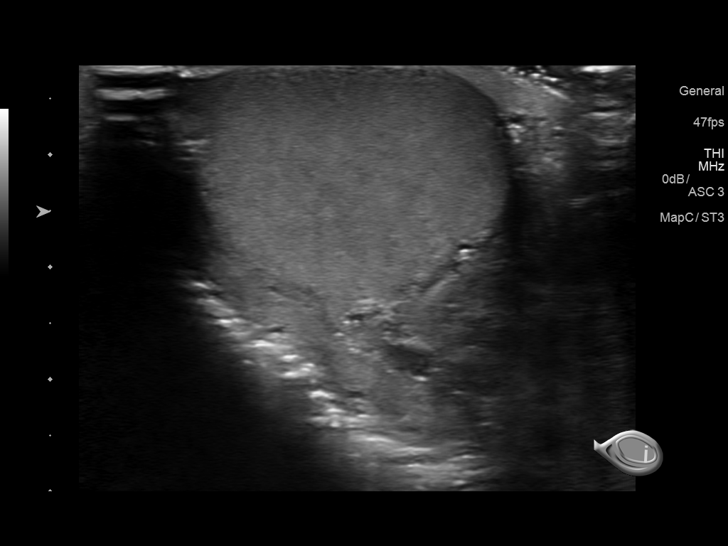
[im 16/62]
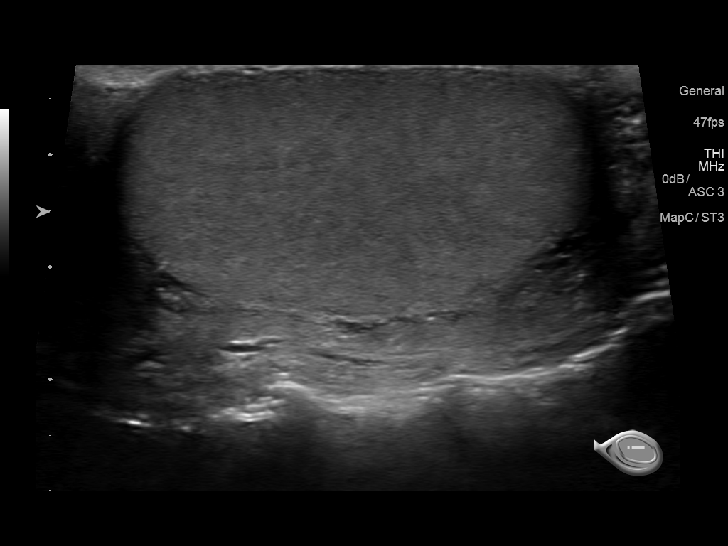
[im 21/62]
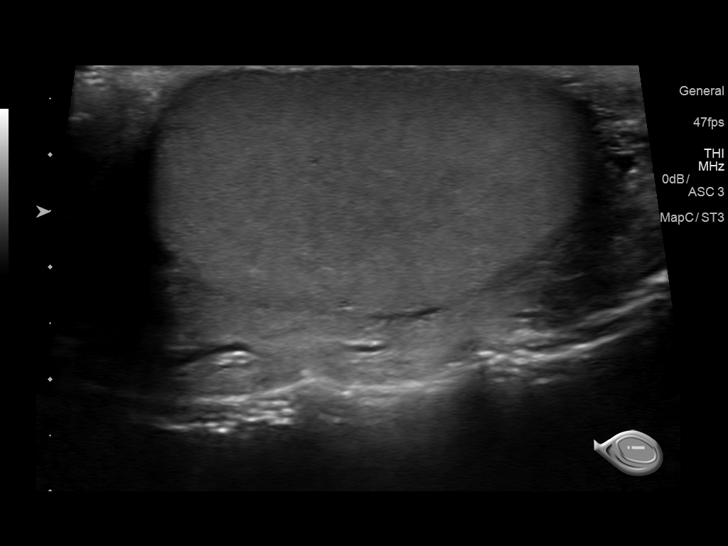
[im 23/62]
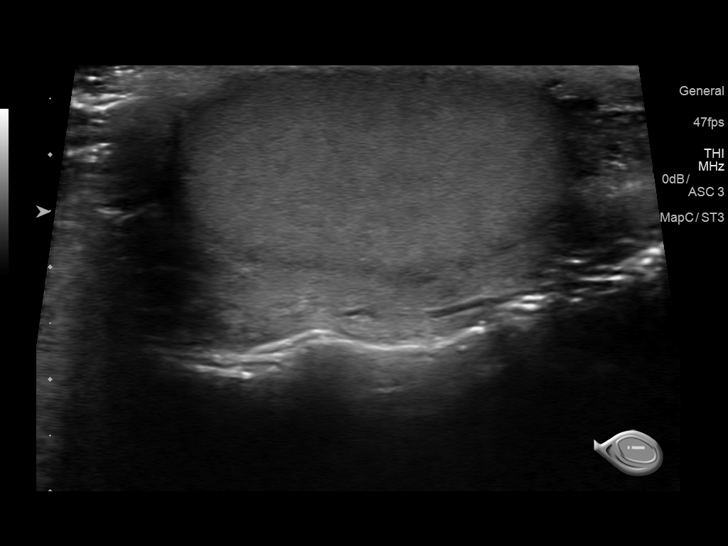
[im 28/62]
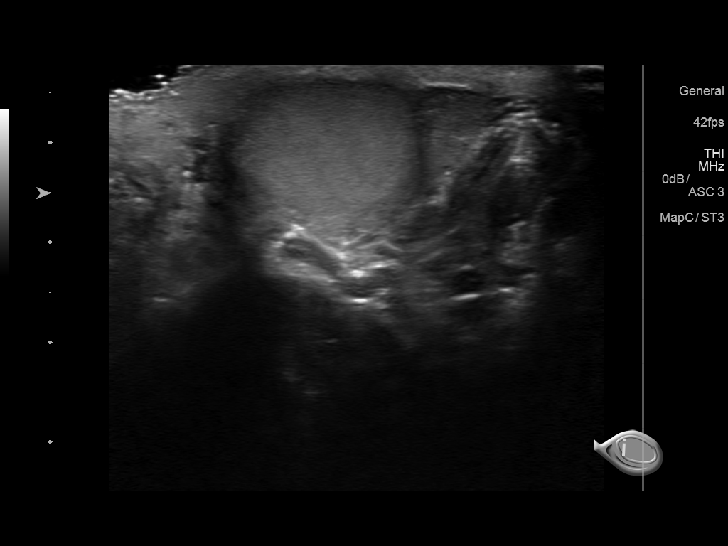
[im 34/62]
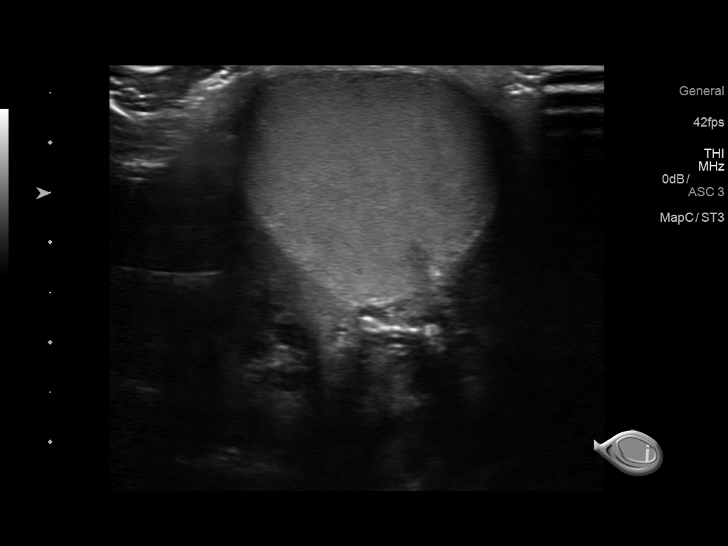
[im 39/62]
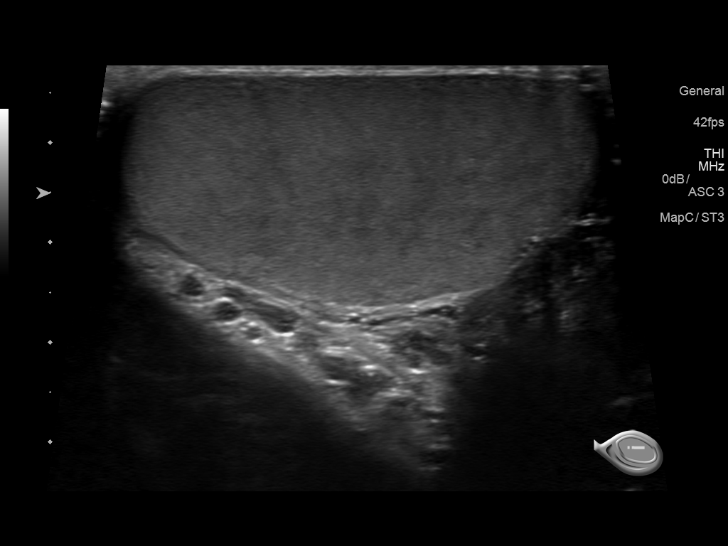
[im 41/62]
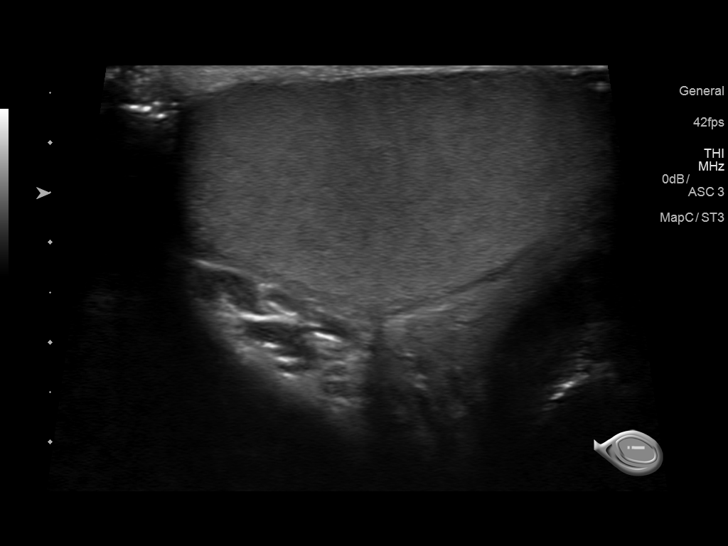
[im 46/62]
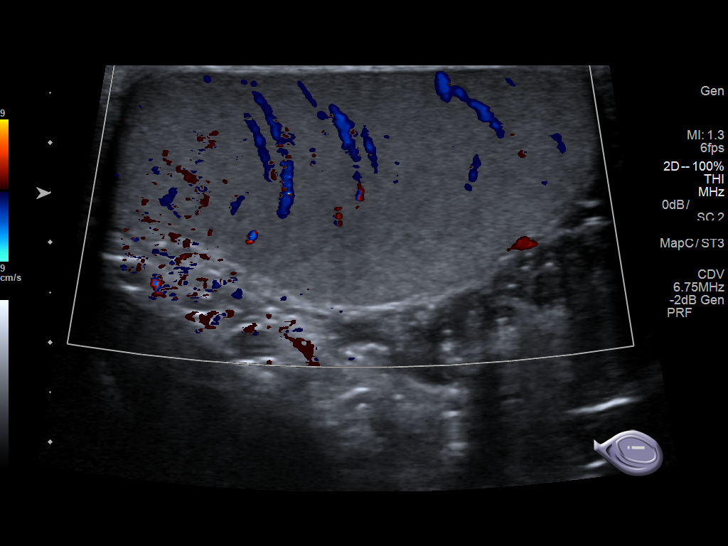
[im 51/62]
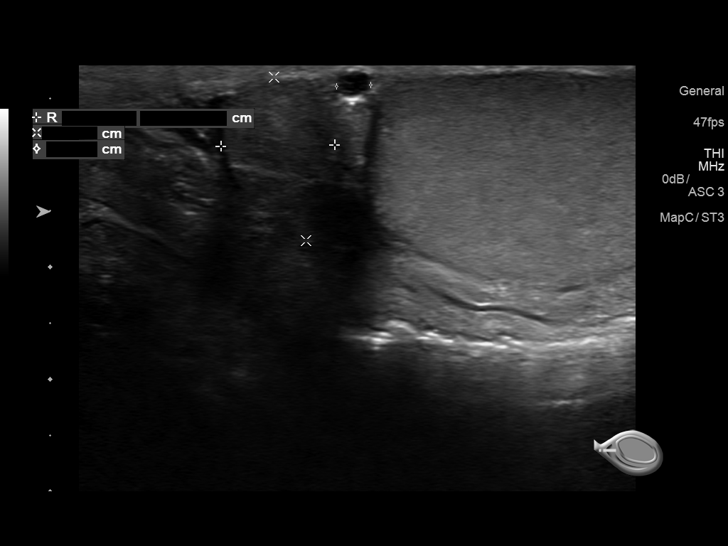
[im 56/62]
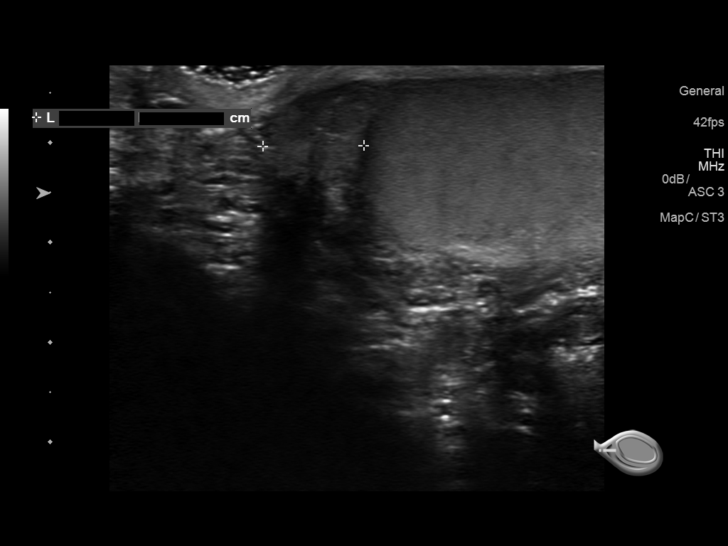
[im 62/62]
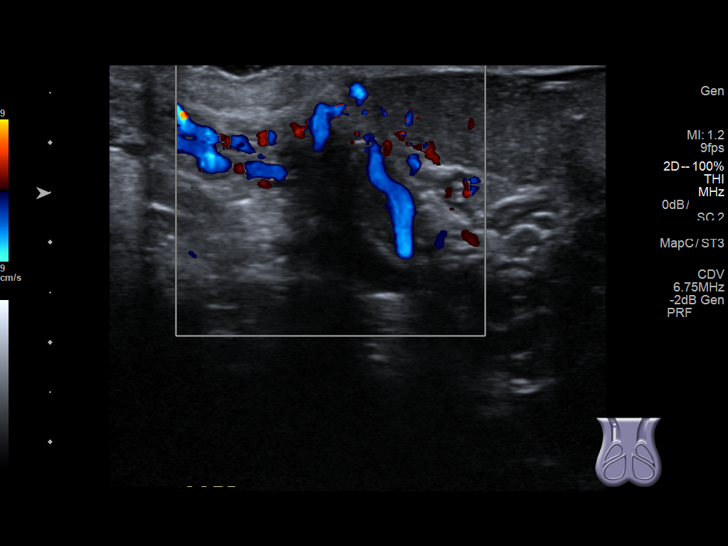

[14 of 25 positions shown; findings below may reference images not displayed]

FINDINGS: Right testicle

Measurements: 4.3 x 2.3 x 3.0 cm. No mass or microlithiasis
visualized.

Left testicle

Measurements: 4.8 x 2.5 x 2.9 cm. No mass or microlithiasis
visualized.

Right epididymis: Normal in size and appearance. There is a 3 mm
right epididymal head cyst.

Left epididymis:  Normal in size and appearance.

Hydrocele:  None visualized.

Varicocele:  None visualized.

Pulsed Doppler interrogation of both testes demonstrates normal low
resistance arterial and venous waveforms bilaterally.
IMPRESSION: Unremarkable testicular ultrasound with doppler detected flow to
both testicles.

## 2017-11-14 ENCOUNTER — Emergency Department (HOSPITAL_COMMUNITY)
Admission: EM | Admit: 2017-11-14 | Discharge: 2017-11-14 | Disposition: A | Discharge status: Home / Self Care | Payer: Self-pay | Attending: Emergency Medicine | Admitting: Emergency Medicine

## 2017-11-14 ENCOUNTER — Encounter (HOSPITAL_COMMUNITY): Payer: Self-pay | Admitting: *Deleted

## 2017-11-14 ENCOUNTER — Other Ambulatory Visit: Payer: Self-pay

## 2017-11-14 DIAGNOSIS — Z5321 Procedure and treatment not carried out due to patient leaving prior to being seen by health care provider: Secondary | ICD-10-CM | POA: Insufficient documentation

## 2017-11-14 DIAGNOSIS — M545 Low back pain: Secondary | ICD-10-CM | POA: Insufficient documentation

## 2017-11-14 NOTE — ED Triage Notes (Signed)
Pt reports pain from his shoulders down in to his feet. Pt reports pain is worse in his lower back. Pt reports using 'muscle rub" without relief. Pt reports these symptoms x 1 month.

## 2017-11-23 ENCOUNTER — Emergency Department (HOSPITAL_COMMUNITY)
Admission: EM | Admit: 2017-11-23 | Discharge: 2017-11-23 | Disposition: A | Discharge status: Home / Self Care | Payer: Self-pay | Attending: Emergency Medicine | Admitting: Emergency Medicine

## 2017-11-23 ENCOUNTER — Encounter (HOSPITAL_COMMUNITY): Payer: Self-pay | Admitting: Emergency Medicine

## 2017-11-23 ENCOUNTER — Other Ambulatory Visit: Payer: Self-pay

## 2017-11-23 DIAGNOSIS — G8929 Other chronic pain: Secondary | ICD-10-CM | POA: Insufficient documentation

## 2017-11-23 DIAGNOSIS — M5441 Lumbago with sciatica, right side: Secondary | ICD-10-CM | POA: Insufficient documentation

## 2017-11-23 DIAGNOSIS — Z79899 Other long term (current) drug therapy: Secondary | ICD-10-CM | POA: Insufficient documentation

## 2017-11-23 DIAGNOSIS — F17210 Nicotine dependence, cigarettes, uncomplicated: Secondary | ICD-10-CM | POA: Insufficient documentation

## 2017-11-23 MED ORDER — CYCLOBENZAPRINE HCL 10 MG PO TABS
10.0000 mg | ORAL_TABLET | Freq: Once | ORAL | Status: AC
  Administered 2017-11-23: 10 mg via ORAL
  Filled 2017-11-23: qty 1

## 2017-11-23 MED ORDER — OXYCODONE-ACETAMINOPHEN 5-325 MG PO TABS
1.0000 | ORAL_TABLET | Freq: Once | ORAL | Status: AC
  Administered 2017-11-23: 1 via ORAL
  Filled 2017-11-23: qty 1

## 2017-11-23 MED ORDER — KETOROLAC TROMETHAMINE 30 MG/ML IJ SOLN
60.0000 mg | Freq: Once | INTRAMUSCULAR | Status: AC
  Administered 2017-11-23: 60 mg via INTRAMUSCULAR
  Filled 2017-11-23: qty 2

## 2017-11-23 MED ORDER — TRAMADOL HCL 50 MG PO TABS: 50.0000 mg | ORAL_TABLET | Freq: Four times a day (QID) | ORAL | 0 refills | Status: DC | PRN

## 2017-11-23 MED ORDER — PREDNISONE 10 MG PO TABS: ORAL_TABLET | ORAL | 0 refills | Status: DC

## 2017-11-23 MED ORDER — CYCLOBENZAPRINE HCL 10 MG PO TABS: 10.0000 mg | ORAL_TABLET | Freq: Three times a day (TID) | ORAL | 0 refills | Status: DC | PRN
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
## 2017-11-23 NOTE — ED Triage Notes (Signed)
Pt c/o intermittent muscle spasms in back and back pain all year, denies seeing PCP

## 2017-11-23 NOTE — Discharge Instructions (Addendum)
Alternate ice and heat to your lower back.  Avoid bending or twisting movements.  Call 1 of the clinics listed above to establish primary care.  Return to the ER for any worsening symptoms such as fever, chills, abdominal pain, or increasing pain

## 2018-02-28 ENCOUNTER — Emergency Department (HOSPITAL_COMMUNITY)
Admission: EM | Admit: 2018-02-28 | Discharge: 2018-02-28 | Disposition: A | Discharge status: Home / Self Care | Payer: Self-pay | Attending: Emergency Medicine | Admitting: Emergency Medicine

## 2018-02-28 ENCOUNTER — Encounter (HOSPITAL_COMMUNITY): Payer: Self-pay | Admitting: Emergency Medicine

## 2018-02-28 ENCOUNTER — Other Ambulatory Visit: Payer: Self-pay

## 2018-02-28 DIAGNOSIS — M545 Low back pain, unspecified: Secondary | ICD-10-CM

## 2018-02-28 DIAGNOSIS — Z5321 Procedure and treatment not carried out due to patient leaving prior to being seen by health care provider: Secondary | ICD-10-CM | POA: Insufficient documentation

## 2018-02-28 HISTORY — DX: Other chronic pain: G89.29

## 2018-02-28 HISTORY — DX: Dorsalgia, unspecified: M54.9

## 2018-02-28 HISTORY — DX: Pain in unspecified knee: M25.569

## 2018-02-28 NOTE — ED Triage Notes (Signed)
Pt states he popped his toes 2 weeks ago and has had mid lower back pain and right flank pain since then

## 2018-02-28 NOTE — ED Notes (Signed)
Patient called x 1 with no answer. 2nd attempt no one in the waiting room

## 2018-04-29 ENCOUNTER — Emergency Department (HOSPITAL_COMMUNITY)
Admission: EM | Admit: 2018-04-29 | Discharge: 2018-04-29 | Disposition: A | Discharge status: Home / Self Care | Payer: Self-pay | Attending: Emergency Medicine | Admitting: Emergency Medicine

## 2018-04-29 ENCOUNTER — Encounter (HOSPITAL_COMMUNITY): Payer: Self-pay | Admitting: Emergency Medicine

## 2018-04-29 ENCOUNTER — Other Ambulatory Visit: Payer: Self-pay

## 2018-04-29 DIAGNOSIS — S39012A Strain of muscle, fascia and tendon of lower back, initial encounter: Secondary | ICD-10-CM | POA: Insufficient documentation

## 2018-04-29 DIAGNOSIS — F17210 Nicotine dependence, cigarettes, uncomplicated: Secondary | ICD-10-CM | POA: Insufficient documentation

## 2018-04-29 DIAGNOSIS — Y999 Unspecified external cause status: Secondary | ICD-10-CM | POA: Insufficient documentation

## 2018-04-29 DIAGNOSIS — Y929 Unspecified place or not applicable: Secondary | ICD-10-CM | POA: Insufficient documentation

## 2018-04-29 DIAGNOSIS — X58XXXA Exposure to other specified factors, initial encounter: Secondary | ICD-10-CM | POA: Insufficient documentation

## 2018-04-29 DIAGNOSIS — Y939 Activity, unspecified: Secondary | ICD-10-CM | POA: Insufficient documentation

## 2018-04-29 DIAGNOSIS — Z79899 Other long term (current) drug therapy: Secondary | ICD-10-CM | POA: Insufficient documentation

## 2018-04-29 HISTORY — DX: Accidental discharge from unspecified firearms or gun, initial encounter: W34.00XA

## 2018-04-29 HISTORY — DX: Puncture wound without foreign body, right thigh, initial encounter: S71.131A

## 2018-04-29 MED ORDER — KETOROLAC TROMETHAMINE 30 MG/ML IJ SOLN
30.0000 mg | Freq: Once | INTRAMUSCULAR | Status: AC
  Administered 2018-04-29: 30 mg via INTRAMUSCULAR
  Filled 2018-04-29: qty 1

## 2018-04-29 MED ORDER — DIAZEPAM 5 MG PO TABS
5.0000 mg | ORAL_TABLET | Freq: Once | ORAL | Status: AC
  Administered 2018-04-29: 5 mg via ORAL
  Filled 2018-04-29: qty 1

## 2018-04-29 MED ORDER — DEXAMETHASONE SODIUM PHOSPHATE 10 MG/ML IJ SOLN
10.0000 mg | Freq: Once | INTRAMUSCULAR | Status: AC
  Administered 2018-04-29: 10 mg via INTRAMUSCULAR
  Filled 2018-04-29: qty 1

## 2018-04-29 MED ORDER — PREDNISONE 10 MG (21) PO TBPK: ORAL_TABLET | Freq: Every day | ORAL | 0 refills | Status: DC

## 2018-04-29 MED ORDER — CYCLOBENZAPRINE HCL 10 MG PO TABS: 10.0000 mg | ORAL_TABLET | Freq: Two times a day (BID) | ORAL | 0 refills | Status: DC | PRN
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
## 2018-04-29 NOTE — ED Triage Notes (Signed)
Pt c/o back pain x 6-12 mos, pt states he is unable to lay on left side without the right side going numb

## 2022-01-18 ENCOUNTER — Encounter (HOSPITAL_COMMUNITY): Payer: Self-pay | Admitting: Emergency Medicine

## 2022-01-18 ENCOUNTER — Other Ambulatory Visit: Payer: Self-pay

## 2022-01-18 ENCOUNTER — Emergency Department (HOSPITAL_COMMUNITY)
Admission: EM | Admit: 2022-01-18 | Discharge: 2022-01-18 | Disposition: A | Discharge status: Home / Self Care | Payer: 59 | Attending: Emergency Medicine | Admitting: Emergency Medicine

## 2022-01-18 DIAGNOSIS — B360 Pityriasis versicolor: Secondary | ICD-10-CM | POA: Diagnosis not present

## 2022-01-18 DIAGNOSIS — M545 Low back pain, unspecified: Secondary | ICD-10-CM | POA: Insufficient documentation

## 2022-01-18 MED ORDER — SELENIUM SULFIDE 2.5 % EX LOTN: 1.0000 | TOPICAL_LOTION | Freq: Every day | CUTANEOUS | 0 refills | Status: AC

## 2022-01-18 MED ORDER — METHOCARBAMOL 500 MG PO TABS: 500.0000 mg | ORAL_TABLET | Freq: Three times a day (TID) | ORAL | 0 refills | Status: AC | PRN

## 2022-01-18 MED ORDER — FLUCONAZOLE 150 MG PO TABS: 300.0000 mg | ORAL_TABLET | ORAL | 0 refills | Status: AC

## 2022-01-18 MED ORDER — IBUPROFEN 800 MG PO TABS: 800.0000 mg | ORAL_TABLET | Freq: Four times a day (QID) | ORAL | 0 refills | Status: DC | PRN

## 2022-01-18 MED ORDER — FLUCONAZOLE 150 MG PO TABS
300.0000 mg | ORAL_TABLET | Freq: Once | ORAL | Status: AC
  Administered 2022-01-18: 300 mg via ORAL
  Filled 2022-01-18: qty 2

## 2022-01-18 NOTE — ED Provider Notes (Signed)
[facility] EMERGENCY DEPARTMENT Provider Note   CSN: [id number] Arrival date & time: 01/18/22  0156     History  Chief Complaint  Patient presents with   Back Pain    Timothy Ayala is a 31 y.o. male.  Presents to the emergency department with 2 separate complaints.  Patient has been experiencing low back pain for the last month or so.  He reports that it worsens when he bends or twists, sometimes causes it to "lock up".  Does not radiate to the legs.  No change in bowel or bladder function.  Patient also complaining of a rash on his body.  Rash is spreading.  Patient reports diffuse itching.       Home Medications Prior to Admission medications   Medication Sig Start Date End Date Taking? Authorizing Provider  fluconazole (DIFLUCAN) 150 MG tablet Take 2 tablets (300 mg total) by mouth once a week for 2 doses. 01/18/22 01/26/22 Yes Caliyah Sieh, Canary Brim, MD  ibuprofen (ADVIL) 800 MG tablet Take 1 tablet (800 mg total) by mouth every 6 (six) hours as needed for moderate pain. 01/18/22  Yes Vita Currin, Canary Brim, MD  methocarbamol (ROBAXIN) 500 MG tablet Take 1 tablet (500 mg total) by mouth every 8 (eight) hours as needed for muscle spasms. 01/18/22  Yes Calder Oblinger, Canary Brim, MD  selenium sulfide (SELSUN) 2.5 % shampoo Apply 1 Application topically daily. Apply to areas of skin with rash, leave on for 10 minutes, then rinse off 01/18/22  Yes Tonesha Tsou, Canary Brim, MD  acetaminophen (TYLENOL) 500 MG tablet Take 1,000 mg by mouth daily as needed for mild pain or moderate pain.    [provider]      Allergies    Patient has no known allergies.    Review of Systems   Review of Systems  Physical Exam Updated Vital Signs BP 118/79 (BP Location: Right Arm)   Pulse (!) 55   Temp 97.8 F (36.6 C) (Oral)   Resp 18   Ht 6\' 3"  (1.905 m)   Wt 99.8 kg   SpO2 100%   BMI 27.50 kg/m  Physical Exam Vitals and nursing note reviewed.  Constitutional:      General: He  is not in acute distress.    Appearance: He is well-developed.  HENT:     Head: Normocephalic and atraumatic.     Mouth/Throat:     Mouth: Mucous membranes are moist.  Eyes:     General: Vision grossly intact. Gaze aligned appropriately.     Extraocular Movements: Extraocular movements intact.     Conjunctiva/sclera: Conjunctivae normal.  Cardiovascular:     Rate and Rhythm: Normal rate and regular rhythm.     Pulses: Normal pulses.     Heart sounds: Normal heart sounds, S1 normal and S2 normal. No murmur heard.    No friction rub. No gallop.  Pulmonary:     Effort: Pulmonary effort is normal. No respiratory distress.     Breath sounds: Normal breath sounds.  Abdominal:     Palpations: Abdomen is soft.     Tenderness: There is no abdominal tenderness. There is no guarding or rebound.     Hernia: No hernia is present.  Musculoskeletal:        General: No swelling.     Cervical back: Full passive range of motion without pain, normal range of motion and neck supple. No pain with movement, spinous process tenderness or muscular tenderness. Normal range of motion.  Lumbar back: Spasms and tenderness present. Negative right straight leg raise test and negative left straight leg raise test.     Right lower leg: No edema.     Left lower leg: No edema.  Skin:    General: Skin is warm and dry.     Capillary Refill: Capillary refill takes less than 2 seconds.     Findings: Rash (Diffuse circular nonraised patches of decreased pigmentation that are confluent) present. No ecchymosis, erythema, lesion or wound.  Neurological:     Mental Status: He is alert and oriented to person, place, and time.     GCS: GCS eye subscore is 4. GCS verbal subscore is 5. GCS motor subscore is 6.     Cranial Nerves: Cranial nerves 2-12 are intact.     Sensory: Sensation is intact.     Motor: Motor function is intact. No weakness or abnormal muscle tone.     Coordination: Coordination is intact.   Psychiatric:        Mood and Affect: Mood normal.        Speech: Speech normal.        Behavior: Behavior normal.     ED Results / Procedures / Treatments   Labs (all labs ordered are listed, but only abnormal results are displayed) Labs Reviewed - No data to display  EKG None  Radiology No results found.  Procedures Procedures    Medications Ordered in ED Medications  fluconazole (DIFLUCAN) tablet 300 mg (has no administration in time range)    ED Course/ Medical Decision Making/ A&P                           Medical Decision Making  Patient presents to the ER with musculoskeletal back pain. Examination reveals back tenderness without any associated neurologic findings. Patient's strength, sensation and reflexes were normal. There is no evidence of saddle anesthesia. Patient does not have a foot drop. Patient has not experienced any change in bowel or bladder function. As such, patient did not require any imaging or further studies. Patient was treated with analgesia.  Rash is consistent with a tinea versicolor hypopigmentation.        Final Clinical Impression(s) / ED Diagnoses Final diagnoses:  Acute midline low back pain without sciatica  Tinea versicolor    Rx / DC Orders ED Discharge Orders          Ordered    fluconazole (DIFLUCAN) 150 MG tablet  Weekly        01/18/22 0230    selenium sulfide (SELSUN) 2.5 % shampoo  Daily        01/18/22 0230    ibuprofen (ADVIL) 800 MG tablet  Every 6 hours PRN        01/18/22 0230    methocarbamol (ROBAXIN) 500 MG tablet  Every 8 hours PRN        01/18/22 0230              Gilda Crease, MD 01/18/22 0231

## 2022-01-18 NOTE — ED Triage Notes (Signed)
Pt with c/o lower back pain x 1 month and rash x 2-3 months "all over his body".

## 2022-07-24 ENCOUNTER — Encounter (HOSPITAL_COMMUNITY): Payer: Self-pay

## 2022-07-24 ENCOUNTER — Other Ambulatory Visit: Payer: Self-pay

## 2022-07-24 ENCOUNTER — Emergency Department (HOSPITAL_COMMUNITY)
Admission: EM | Admit: 2022-07-24 | Discharge: 2022-07-24 | Disposition: A | Discharge status: Home / Self Care | Payer: 59 | Attending: Emergency Medicine | Admitting: Emergency Medicine

## 2022-07-24 DIAGNOSIS — R220 Localized swelling, mass and lump, head: Secondary | ICD-10-CM | POA: Diagnosis present

## 2022-07-24 DIAGNOSIS — K047 Periapical abscess without sinus: Secondary | ICD-10-CM

## 2022-07-24 MED ORDER — IBUPROFEN 800 MG PO TABS: 800.0000 mg | ORAL_TABLET | Freq: Three times a day (TID) | ORAL | 0 refills | Status: DC

## 2022-07-24 MED ORDER — PENICILLIN V POTASSIUM 500 MG PO TABS: 500.0000 mg | ORAL_TABLET | Freq: Three times a day (TID) | ORAL | 0 refills | Status: DC

## 2022-07-24 MED ORDER — HYDROCODONE-ACETAMINOPHEN 5-325 MG PO TABS: ORAL_TABLET | ORAL | 0 refills | Status: DC

## 2022-07-24 MED ORDER — ONDANSETRON HCL 4 MG/2ML IJ SOLN: 4.0000 mg | Freq: Once | INTRAMUSCULAR | Status: DC

## 2022-07-24 MED ORDER — CLINDAMYCIN PHOSPHATE 600 MG/50ML IV SOLN: 600.0000 mg | Freq: Once | INTRAVENOUS | Status: DC

## 2022-07-24 MED ORDER — MORPHINE SULFATE (PF) 4 MG/ML IV SOLN: 4.0000 mg | Freq: Once | INTRAVENOUS | Status: DC

## 2022-07-24 NOTE — ED Triage Notes (Signed)
Pt reports his right upper tooth fell out 2 days ago and he is having severe pain from his mouth to his ear.

## 2022-07-24 NOTE — ED Provider Notes (Signed)
[facility] Provider Note   CSN: [id number] Arrival date & time: 07/24/22  1450     History  Chief Complaint  Patient presents with   Dental Pain    Timothy Ayala is a 32 y.o. male.   Dental Pain Associated symptoms: facial swelling   Associated symptoms: no congestion, no fever, no headaches and no neck pain         Timothy Ayala is a 32 y.o. male who presents to the Emergency Department complaining of left-sided facial pain and numbness x 2 days.  He states he has had a decayed left upper tooth for some time.  States tooth "fell out" few days ago and since then he has been having facial swelling, pain, and numbness of his left face.  States the pain radiates toward his left ear and is sharp and stabbing in quality.  He endorses decreased food intake and states that any cold liquids or foods makes his pain worse.  Denies fever, vomiting, swelling of his neck and difficulty opening or closing his mouth.  He is not contacted a dentist or taken any medications for symptoms relief   Home Medications Prior to Admission medications   Medication Sig Start Date End Date Taking? Authorizing Provider  acetaminophen (TYLENOL) 500 MG tablet Take 1,000 mg by mouth daily as needed for mild pain or moderate pain.    [provider]  ibuprofen (ADVIL) 800 MG tablet Take 1 tablet (800 mg total) by mouth every 6 (six) hours as needed for moderate pain. 01/18/22   Orpah Greek, MD  methocarbamol (ROBAXIN) 500 MG tablet Take 1 tablet (500 mg total) by mouth every 8 (eight) hours as needed for muscle spasms. 01/18/22   Orpah Greek, MD  selenium sulfide (SELSUN) 2.5 % shampoo Apply 1 Application topically daily. Apply to areas of skin with rash, leave on for 10 minutes, then rinse off 01/18/22   Pollina, Gwenyth Allegra, MD      Allergies    Patient has no known allergies.    Review of Systems   Review of Systems   Constitutional:  Negative for chills and fever.  HENT:  Positive for dental problem, ear pain and facial swelling. Negative for congestion, rhinorrhea, sore throat and trouble swallowing.   Eyes:  Negative for visual disturbance.  Respiratory:  Negative for cough and shortness of breath.   Cardiovascular:  Negative for chest pain.  Gastrointestinal:  Negative for abdominal pain, diarrhea, nausea and vomiting.  Musculoskeletal:  Negative for neck pain and neck stiffness.  Neurological:  Negative for dizziness, syncope, facial asymmetry, weakness and headaches.    Physical Exam Updated Vital Signs BP 133/83 (BP Location: Right Arm)   Pulse 84   Temp 98.2 F (36.8 C) (Oral)   Resp 20   Ht 6\' 2"  (1.88 m)   Wt 98.9 kg   SpO2 100%   BMI 27.99 kg/m  Physical Exam Vitals and nursing note reviewed.  Constitutional:      General: He is not in acute distress.    Appearance: Normal appearance. He is not ill-appearing or toxic-appearing.  HENT:     Right Ear: Tympanic membrane normal.     Left Ear: Tympanic membrane and ear canal normal.     Mouth/Throat:     Mouth: Mucous membranes are moist.     Dentition: Dental tenderness, gingival swelling and dental caries present.     Pharynx: Oropharynx is clear. Uvula  midline. No pharyngeal swelling, posterior oropharyngeal erythema or uvula swelling.     Comments: Dental caries with tenderness along the gingiva of the left upper second premolar.  Tooth decayed to the gumline.  Mild edema of the left face.   uvula midline nonedematous.  No trismus no abnormalities of the floor the mouth Eyes:     Extraocular Movements: Extraocular movements intact.  Cardiovascular:     Rate and Rhythm: Normal rate and regular rhythm.     Pulses: Normal pulses.  Pulmonary:     Effort: Pulmonary effort is normal.  Abdominal:     Palpations: Abdomen is soft.     Tenderness: There is no abdominal tenderness.  Musculoskeletal:        General: Normal range of  motion.     Cervical back: Normal range of motion. No rigidity.  Lymphadenopathy:     Cervical: No cervical adenopathy.  Skin:    General: Skin is warm.     Capillary Refill: Capillary refill takes less than 2 seconds.     Findings: No rash.  Neurological:     General: No focal deficit present.     Mental Status: He is alert.     Cranial Nerves: No cranial nerve deficit or dysarthria.     Sensory: Sensation is intact. No sensory deficit.     Motor: Motor function is intact. No weakness.     Coordination: Coordination is intact.     Comments: CN II through XII grossly intact.  Speech clear.  No facial droop     ED Results / Procedures / Treatments   Labs (all labs ordered are listed, but only abnormal results are displayed) Labs Reviewed - No data to display  EKG None  Radiology No results found.  Procedures Procedures    Medications Ordered in ED Medications  clindamycin (CLEOCIN) IVPB 600 mg (has no administration in time range)  morphine (PF) 4 MG/ML injection 4 mg (has no administration in time range)  ondansetron (ZOFRAN) injection 4 mg (has no administration in time range)    ED Course/ Medical Decision Making/ A&P                             Medical Decision Making Patient here with 2-day history of facial pain swelling and numbness.  History of dental caries and had a "tooth fall out" just prior to onset of his current symptoms.  Pain worsens with attempted chewing and cold liquids or foods.  Pain radiating toward left ear.  On exam, patient is anxious appearing holding his left face.  There is dental caries present with left upper premolar decayed to the level of the gumline.  There is some edema of the adjacent gingiva.  This area is tender to palpation.  Do not appreciate any abnormalities to the floor of the mouth and there is no trismus on exam  No swelling of the neck.  He is handling his secretions well.  I suspect his symptoms are related to dental  abscess, acute neurologic process considered but felt less likely as this began in the setting of dental pain.  Ludewig's angina also considered but felt less likely with absence of trismus and no abnormalities of the floor the mouth  Amount and/or Complexity of Data Reviewed Discussion of management or test interpretation with external provider(s): Patient with developing dental abscess no indications of Ludewig's angina.  IV pain medication and IV clindamycin ordered.  On recheck, patient states that he does not want to wait for IV treatment and is requesting to be discharged home.  Prescription will be written for short course of pain medication, database reviewed, oral antibiotics also prescribed.  Patient advised to return to the emergency department if his symptoms worsen or if he changes his mind and is willing to stay for IV treatment.  He will also be given resource information for local dentistry  Risk Prescription drug management.           Final Clinical Impression(s) / ED Diagnoses Final diagnoses:  Dental abscess    Rx / DC Orders ED Discharge Orders     None         Kem Parkinson, PA-C 07/24/22 1721    Sherwood Gambler, MD 07/27/22 2300

## 2022-07-24 NOTE — Discharge Instructions (Signed)
You have a dental abscess that is causing your symptoms.  It is important that you follow-up with a dentist when possible.  You have been prescribed antibiotics and a short course of pain medication to help your symptoms as well.  Please rinse with warm salt water several times a day.  You may also use Orajel to the affected area to help with your pain.  Return to the emergency department for any new or worsening symptoms.  I have provided a list of some of the local dentist for you to arrange follow-up with.

## 2022-09-08 ENCOUNTER — Other Ambulatory Visit: Payer: Self-pay

## 2022-09-08 ENCOUNTER — Emergency Department (HOSPITAL_COMMUNITY)
Admission: EM | Admit: 2022-09-08 | Discharge: 2022-09-08 | Disposition: A | Discharge status: Home / Self Care | Payer: 59 | Attending: Emergency Medicine | Admitting: Emergency Medicine

## 2022-09-08 ENCOUNTER — Encounter (HOSPITAL_COMMUNITY): Payer: Self-pay | Admitting: Emergency Medicine

## 2022-09-08 DIAGNOSIS — K0889 Other specified disorders of teeth and supporting structures: Secondary | ICD-10-CM

## 2022-09-08 DIAGNOSIS — K029 Dental caries, unspecified: Secondary | ICD-10-CM | POA: Insufficient documentation

## 2022-09-08 DIAGNOSIS — F17200 Nicotine dependence, unspecified, uncomplicated: Secondary | ICD-10-CM | POA: Insufficient documentation

## 2022-09-08 MED ORDER — AMOXICILLIN-POT CLAVULANATE 875-125 MG PO TABS: 1.0000 | ORAL_TABLET | Freq: Two times a day (BID) | ORAL | 0 refills | Status: AC

## 2022-09-08 MED ORDER — IBUPROFEN 600 MG PO TABS: 600.0000 mg | ORAL_TABLET | Freq: Four times a day (QID) | ORAL | 0 refills | Status: AC | PRN

## 2022-09-08 MED ORDER — HYDROCODONE-ACETAMINOPHEN 5-325 MG PO TABS
1.0000 | ORAL_TABLET | Freq: Once | ORAL | Status: AC
  Administered 2022-09-08: 1 via ORAL
  Filled 2022-09-08: qty 1

## 2022-09-08 MED ORDER — HYDROCODONE-ACETAMINOPHEN 5-325 MG PO TABS: 1.0000 | ORAL_TABLET | Freq: Four times a day (QID) | ORAL | 0 refills | Status: AC | PRN

## 2022-09-08 NOTE — Discharge Instructions (Addendum)
Note the workup today was overall reassuring.  Medicine sent to local pharmacy.  As discussed, symptoms are likely to re-occur until teeth are addressed.  Recommend follow-up with dentistry.  See information attached.  Please do not hesitate to return to emergency department for worrisome signs and symptoms we discussed become apparent.

## 2022-09-08 NOTE — ED Provider Notes (Signed)
[facility] Provider Note   CSN: [id number] Arrival date & time: 09/08/22  1701     History  Chief Complaint  Patient presents with   Dental Pain    Timothy Ayala is a 32 y.o. male.   Dental Pain   32 year old male presents emergency department with complaints of dental pain.  Patient with history of dental pain which brought him to the emergency department at the end of January.  Patient with multiple known caries, fractured teeth that have intermittently caused some problems over the last several years.  Notes symptoms resolved with antibiotic use and medicines prescribed from prior ED visit.  Notes returning of symptoms over the past few days.  Patient states that he has been using ibuprofen regularly every 4-6 hours and has found no relief of symptoms.  Denies difficulty swallowing food/liquids, changes in voice, difficulty opening mouth, drooling, difficulty breathing, fever, chills, night sweats.  No significant pertinent past medical history.  Home Medications Prior to Admission medications   Medication Sig Start Date End Date Taking? Authorizing Provider  amoxicillin-clavulanate (AUGMENTIN) 875-125 MG tablet Take 1 tablet by mouth every 12 (twelve) hours. 09/08/22  Yes Dion Saucier A, PA  HYDROcodone-acetaminophen (NORCO/VICODIN) 5-325 MG tablet Take 1 tablet by mouth every 6 (six) hours as needed for severe pain. 09/08/22  Yes Dion Saucier A, PA  ibuprofen (ADVIL) 600 MG tablet Take 1 tablet (600 mg total) by mouth every 6 (six) hours as needed. 09/08/22  Yes Dion Saucier A, PA  methocarbamol (ROBAXIN) 500 MG tablet Take 1 tablet (500 mg total) by mouth every 8 (eight) hours as needed for muscle spasms. 01/18/22   Orpah Greek, MD  selenium sulfide (SELSUN) 2.5 % shampoo Apply 1 Application topically daily. Apply to areas of skin with rash, leave on for 10 minutes, then rinse off 01/18/22   Pollina, Gwenyth Allegra, MD       Allergies    Patient has no known allergies.    Review of Systems   Review of Systems  All other systems reviewed and are negative.   Physical Exam Updated Vital Signs BP 120/75 (BP Location: Right Arm)   Pulse 65   Temp 98.2 F (36.8 C) (Oral)   Resp 18   Ht '6\' 3"'$  (1.905 m)   Wt 82.6 kg   SpO2 100%   BMI 22.75 kg/m  Physical Exam Vitals and nursing note reviewed.  Constitutional:      General: He is not in acute distress.    Appearance: He is well-developed.  HENT:     Head: Normocephalic and atraumatic.     Mouth/Throat:      Comments: Patient with multiple caries and fractured teeth.  Area where patient's pain is present as indicated above.  Patient with tenderness to gingiva with no obvious palpable fluctuance.  Possible mild external swelling appreciated.  No overlying erythema.  No trismus appreciated.  Uvula midline rise symmetric with phonation.  Tonsils 01+ bilaterally with no obvious exudate.  No sublingual or submandibular swelling appreciated.  Patient with no auscultatory stridor. Eyes:     Conjunctiva/sclera: Conjunctivae normal.  Cardiovascular:     Rate and Rhythm: Normal rate and regular rhythm.     Heart sounds: No murmur heard. Pulmonary:     Effort: Pulmonary effort is normal. No respiratory distress.     Breath sounds: Normal breath sounds.  Abdominal:     Palpations: Abdomen is soft.  Tenderness: There is no abdominal tenderness.  Musculoskeletal:        General: No swelling.     Cervical back: Neck supple.  Skin:    General: Skin is warm and dry.     Capillary Refill: Capillary refill takes less than 2 seconds.  Neurological:     Mental Status: He is alert.  Psychiatric:        Mood and Affect: Mood normal.     ED Results / Procedures / Treatments   Labs (all labs ordered are listed, but only abnormal results are displayed) Labs Reviewed - No data to display  EKG None  Radiology No results found.  Procedures Procedures     Medications Ordered in ED Medications  HYDROcodone-acetaminophen (NORCO/VICODIN) 5-325 MG per tablet 1 tablet (has no administration in time range)    ED Course/ Medical Decision Making/ A&P                             Medical Decision Making Risk Prescription drug management.   This patient presents to the ED for concern of dental pain, this involves an extensive number of treatment options, and is a complaint that carries with it a high risk of complications and morbidity.  The differential diagnosis includes fracture truth, Carrie, periapical abscess, peritonsillar abscess, Ludwig angina, necrotizing ulcerative gingivitis, Lemierre's disease   Co morbidities that complicate the patient evaluation  See HPI   Additional history obtained:  Additional history obtained from EMR External records from outside source obtained and reviewed including hospital records   Lab Tests:  N/a   Imaging Studies ordered:  N/a   Cardiac Monitoring: / EKG:  The patient was maintained on a cardiac monitor.  I personally viewed and interpreted the cardiac monitored which showed an underlying rhythm of: Sinus rhythm   Consultations Obtained:  N/a   Problem List / ED Course / Critical interventions / Medication management  Dental pain I ordered medication including Norco f  Reevaluation of the patient after these medicines showed that the patient improved I have reviewed the patients home medicines and have made adjustments as needed   Social Determinants of Health:  Chronic cigarette use.  Denies illicit drug use.   Test / Admission - Considered:  Dental pain Vitals signs within normal range and stable throughout visit. Patient with evidence of worsening gingival pain over the past several days.  No evidence of periapical abscess, peritonsillar abscess, Ludwig angina clinically as indicated above.  Patient in no acute distress, tolerating oral secretions as well as  foods and liquids while in the emergency department.  Patient with poor dentition generally with multiple caries, fractured teeth and now cellulitic skin changes to both upper and lower left-sided gum lines.  Will treat with empiric antibiotics.  Patient given dental referral sheet as he does not have dental coverage on his insurance.  Patient again educated regarding potential of recurrences if affected teeth are not addressed.  Treatment plan discussed at length with patient and he acknowledged understanding was agreeable to said plan. Worrisome signs and symptoms were discussed with the patient, and the patient acknowledged understanding to return to the ED if noticed. Patient was stable upon discharge. ]        Final Clinical Impression(s) / ED Diagnoses Final diagnoses:  Pain, dental    Rx / DC Orders ED Discharge Orders          Ordered  ibuprofen (ADVIL) 600 MG tablet  Every 6 hours PRN        09/08/22 1853    amoxicillin-clavulanate (AUGMENTIN) 875-125 MG tablet  Every 12 hours        09/08/22 1853              Wilnette Kales, [place] 09/08/22 1900    Dorie Rank, MD 09/09/22 1730

## 2022-09-08 NOTE — ED Triage Notes (Signed)
Patient c/o left, lower dental pain that started x1 month ago. Patient was currently taking antibiotics and pain medication with some relief but sfter medication done pain is returning. Per patient intermittent fevers. Per patient took Advil today with no relief.

## 2023-02-15 ENCOUNTER — Telehealth: Payer: Self-pay

## 2023-02-15 NOTE — Telephone Encounter (Signed)
Pt needs help finding a PCP.
